# Patient Record
Sex: Male | Born: 1975 | Race: Black or African American | Hispanic: No | Marital: Single | Smoking: Current every day smoker
Health system: Southern US, Community
[De-identification: ages and names within clinical notes are randomized; demographics above are authoritative.]

## PROBLEM LIST (undated history)

## (undated) DIAGNOSIS — F209 Schizophrenia, unspecified: Secondary | ICD-10-CM

## (undated) DIAGNOSIS — F32A Depression, unspecified: Secondary | ICD-10-CM

## (undated) DIAGNOSIS — F319 Bipolar disorder, unspecified: Secondary | ICD-10-CM

## (undated) DIAGNOSIS — F329 Major depressive disorder, single episode, unspecified: Secondary | ICD-10-CM

## (undated) DIAGNOSIS — IMO0002 Reserved for concepts with insufficient information to code with codable children: Secondary | ICD-10-CM

## (undated) HISTORY — PX: ABDOMINAL SURGERY: SHX537

---

## 2014-01-25 ENCOUNTER — Emergency Department (HOSPITAL_COMMUNITY)
Admission: EM | Admit: 2014-01-25 | Discharge: 2014-01-26 | Disposition: A | Discharge status: Other Acute Inpatient Hospital | Payer: Medicaid Other | Attending: Emergency Medicine | Admitting: Emergency Medicine

## 2014-01-25 ENCOUNTER — Encounter (HOSPITAL_COMMUNITY): Payer: Self-pay | Admitting: Emergency Medicine

## 2014-01-25 DIAGNOSIS — F339 Major depressive disorder, recurrent, unspecified: Secondary | ICD-10-CM | POA: Insufficient documentation

## 2014-01-25 DIAGNOSIS — Z8659 Personal history of other mental and behavioral disorders: Secondary | ICD-10-CM | POA: Insufficient documentation

## 2014-01-25 DIAGNOSIS — F17200 Nicotine dependence, unspecified, uncomplicated: Secondary | ICD-10-CM | POA: Insufficient documentation

## 2014-01-25 DIAGNOSIS — F1210 Cannabis abuse, uncomplicated: Secondary | ICD-10-CM | POA: Insufficient documentation

## 2014-01-25 DIAGNOSIS — Z872 Personal history of diseases of the skin and subcutaneous tissue: Secondary | ICD-10-CM | POA: Insufficient documentation

## 2014-01-25 DIAGNOSIS — R45851 Suicidal ideations: Secondary | ICD-10-CM | POA: Insufficient documentation

## 2014-01-25 HISTORY — DX: Bipolar disorder, unspecified: F31.9

## 2014-01-25 HISTORY — DX: Reserved for concepts with insufficient information to code with codable children: IMO0002

## 2014-01-25 HISTORY — DX: Schizophrenia, unspecified: F20.9

## 2014-01-25 LAB — CBC
HEMATOCRIT: 41.8 % (ref 39.0–52.0)
Hemoglobin: 14 g/dL (ref 13.0–17.0)
MCH: 28.2 pg (ref 26.0–34.0)
MCHC: 33.5 g/dL (ref 30.0–36.0)
MCV: 84.3 fL (ref 78.0–100.0)
Platelets: 256 10*3/uL (ref 150–400)
RBC: 4.96 MIL/uL (ref 4.22–5.81)
RDW: 13 % (ref 11.5–15.5)
WBC: 3.7 10*3/uL — AB (ref 4.0–10.5)

## 2014-01-25 LAB — COMPREHENSIVE METABOLIC PANEL
ALT: 15 U/L (ref 0–53)
AST: 19 U/L (ref 0–37)
Albumin: 3.9 g/dL (ref 3.5–5.2)
Alkaline Phosphatase: 80 U/L (ref 39–117)
BILIRUBIN TOTAL: 0.3 mg/dL (ref 0.3–1.2)
BUN: 16 mg/dL (ref 6–23)
CHLORIDE: 104 meq/L (ref 96–112)
CO2: 25 mEq/L (ref 19–32)
Calcium: 9.6 mg/dL (ref 8.4–10.5)
Creatinine, Ser: 1.02 mg/dL (ref 0.50–1.35)
GFR calc non Af Amer: >90 mL/min (ref >90)
GLUCOSE: 110 mg/dL — AB (ref 70–99)
Potassium: 3.8 mEq/L (ref 3.7–5.3)
SODIUM: 142 meq/L (ref 137–147)
Total Protein: 7.2 g/dL (ref 6.0–8.3)

## 2014-01-25 LAB — RAPID URINE DRUG SCREEN, HOSP PERFORMED
AMPHETAMINES: NOT DETECTED
Barbiturates: NOT DETECTED
Benzodiazepines: NOT DETECTED
Cocaine: NOT DETECTED
OPIATES: NOT DETECTED
TETRAHYDROCANNABINOL: POSITIVE — AB

## 2014-01-25 LAB — ETHANOL: Alcohol, Ethyl (B): <11 mg/dL (ref 0–11)

## 2014-01-25 LAB — SALICYLATE LEVEL

## 2014-01-25 LAB — ACETAMINOPHEN LEVEL

## 2014-01-25 MED ORDER — FLUOXETINE HCL 20 MG PO CAPS
20.0000 mg | ORAL_CAPSULE | Freq: Every day | ORAL | Status: DC
  Administered 2014-01-26: 20 mg via ORAL
  Filled 2014-01-25: qty 1

## 2014-01-25 MED ORDER — BUSPIRONE HCL 10 MG PO TABS
5.0000 mg | ORAL_TABLET | Freq: Two times a day (BID) | ORAL | Status: DC
  Administered 2014-01-25 – 2014-01-26 (×2): 5 mg via ORAL
  Filled 2014-01-25 (×2): qty 1

## 2014-01-25 MED ORDER — HYDROXYZINE HCL 25 MG PO TABS: 25.0000 mg | ORAL_TABLET | Freq: Every evening | ORAL | Status: DC | PRN

## 2014-01-25 MED ORDER — LORAZEPAM 1 MG PO TABS: 1.0000 mg | ORAL_TABLET | Freq: Three times a day (TID) | ORAL | Status: DC | PRN

## 2014-01-25 MED ORDER — ACETAMINOPHEN 325 MG PO TABS: 650.0000 mg | ORAL_TABLET | ORAL | Status: DC | PRN

## 2014-01-25 MED ORDER — SODIUM CHLORIDE 0.9 % IV SOLN
INTRAVENOUS | Status: DC
  Administered 2014-01-25: 20 mL/h via INTRAVENOUS

## 2014-01-25 NOTE — ED Notes (Signed)
Pt talking on phone at present.  No distress noted.

## 2014-01-25 NOTE — ED Provider Notes (Signed)
CSN: [id number]     Arrival date & time 01/25/14  1711 History   First MD Initiated Contact with Patient 01/25/14 1716     Chief Complaint  Patient presents with  . Medical Clearance     (Consider location/radiation/quality/duration/timing/severity/associated sxs/prior Treatment) The history is provided by the patient.  pts presents via ems, stating has been feeling depressed w thoughts of suicide. Initially told different stories to ems and providers as to whether he took any extra medication and/or bleach.  On arrival to room pt states he did not drink bleach, and ems notes bottle at home did not appear as though it had been opened in a long time.  Pt did state he took what was left of his medication, which he thinks was 2-3 pills. He denies other medication ingestion. Occasional etoh use/abuse, but denies daily use. Denies other drug use. States physical health at baseline. States depression relates to not being able to find a job, and money issues.     Past Medical History  Diagnosis Date  . Ulcer   . Bipolar disorder   . Schizophrenia    Past Surgical History  Procedure Laterality Date  . Abdominal surgery     No family history on file. History  Substance Use Topics  . Smoking status: Current Every Day Smoker -- 0.25 packs/day for 15 years    Types: Cigarettes  . Smokeless tobacco: Never Used  . Alcohol Use: Yes    Review of Systems  Constitutional: Negative for fever and chills.  HENT: Negative for sore throat.   Eyes: Negative for redness.  Respiratory: Negative for cough and shortness of breath.   Cardiovascular: Negative for chest pain.  Gastrointestinal: Negative for nausea, vomiting and abdominal pain.  Genitourinary: Negative for flank pain.  Musculoskeletal: Negative for back pain and neck pain.  Skin: Negative for rash.  Neurological: Negative for headaches.  Hematological: Does not bruise/bleed easily.  Psychiatric/Behavioral: Positive for dysphoric mood.       Allergies  Review of patient's allergies indicates not on file.  Home Medications   Prior to Admission medications   Not on File   There were no vitals taken for this visit. Physical Exam  Nursing note and vitals reviewed. Constitutional: He is oriented to person, place, and time. He appears well-developed and well-nourished. No distress.  HENT:  Head: Atraumatic.  Mouth/Throat: Oropharynx is clear and moist.  Eyes: Conjunctivae are normal. Pupils are equal, round, and reactive to light. No scleral icterus.  Neck: Neck supple. No tracheal deviation present.  Cardiovascular: Normal rate, regular rhythm, normal heart sounds and intact distal pulses.   Pulmonary/Chest: Effort normal and breath sounds normal. No accessory muscle usage. No respiratory distress.  Abdominal: Soft. He exhibits no distension and no mass. There is no tenderness. There is no rebound and no guarding.  Musculoskeletal: Normal range of motion. He exhibits no edema and no tenderness.  Neurological: He is alert and oriented to person, place, and time.  Skin: Skin is warm and dry. He is not diaphoretic.  Psychiatric: He has a normal mood and affect.  Endorses feelings of depression, but appears to have normal mood/affect. No hallucinations or delusion thoughts. +SI.     ED Course  Procedures (including critical care time)   Results for orders placed during the hospital encounter of 01/25/14  CBC      Result Value Ref Range   WBC 3.7 (*) 4.0 - 10.5 K/uL   RBC 4.96  4.22 - 5.81  MIL/uL   Hemoglobin 14.0  13.0 - 17.0 g/dL   HCT 16.141.8  09.639.0 - 04.552.0 %   MCV 84.3  78.0 - 100.0 fL   MCH 28.2  26.0 - 34.0 pg   MCHC 33.5  30.0 - 36.0 g/dL   RDW 40.913.0  81.111.5 - 91.415.5 %   Platelets 256  150 - 400 K/uL  COMPREHENSIVE METABOLIC PANEL      Result Value Ref Range   Sodium 142  137 - 147 mEq/L   Potassium 3.8  3.7 - 5.3 mEq/L   Chloride 104  96 - 112 mEq/L   CO2 25  19 - 32 mEq/L   Glucose, Bld 110 (*) 70 - 99  mg/dL   BUN 16  6 - 23 mg/dL   Creatinine, Ser 7.821.02  0.50 - 1.35 mg/dL   Calcium 9.6  8.4 - 95.610.5 mg/dL   Total Protein 7.2  6.0 - 8.3 g/dL   Albumin 3.9  3.5 - 5.2 g/dL   AST 19  0 - 37 U/L   ALT 15  0 - 53 U/L   Alkaline Phosphatase 80  39 - 117 U/L   Total Bilirubin 0.3  0.3 - 1.2 mg/dL   GFR calc non Af Amer >90  >90 mL/min   GFR calc Af Amer >90  >90 mL/min  URINE RAPID DRUG SCREEN (HOSP PERFORMED)      Result Value Ref Range   Opiates NONE DETECTED  NONE DETECTED   Cocaine NONE DETECTED  NONE DETECTED   Benzodiazepines NONE DETECTED  NONE DETECTED   Amphetamines NONE DETECTED  NONE DETECTED   Tetrahydrocannabinol POSITIVE (*) NONE DETECTED   Barbiturates NONE DETECTED  NONE DETECTED  ETHANOL      Result Value Ref Range   Alcohol, Ethyl (B) <11  0 - 11 mg/dL  ACETAMINOPHEN LEVEL      Result Value Ref Range   Acetaminophen (Tylenol), Serum <15.0  10 - 30 ug/mL  SALICYLATE LEVEL      Result Value Ref Range   Salicylate Lvl <2.0 (*) 2.8 - 20.0 mg/dL      EKG Interpretation   Date/Time:  Thursday January 25 2014 17:13:56 EDT Ventricular Rate:  65 PR Interval:  117 QRS Duration: 88 QT Interval:  358 QTC Calculation: 372 R Axis:   80 Text Interpretation:  Sinus rhythm Borderline short PR interval Baseline  wander in lead(s) V1 No previous tracing Confirmed by Denton LankSTEINL  MD, Caryn BeeKEVIN  ([id number]) on 01/25/2014 6:25:27 PM      MDM  Iv ns. Labs.  Psych team consulted.  Reviewed nursing notes and prior charts for additional history.   Recheck no gi upset, no nv, no abd pain.  Pt eating/drinking.  Pt remains fully awake and alert.  Psych team eval pending - dispo per psych team.       Suzi RootsKevin E Chablis Losh, MD 01/25/14 [phone]

## 2014-01-25 NOTE — ED Notes (Signed)
Pt transferred from Main ED, report from RN Lauren.  Pt presents with SI, post ingestion of unknown amt of Prozac and Buspirone and bleach.  Pt attempted today, time unknown.  Denies HI, admits to AV hallucinations, seeing Ghosts and hearing things saying to take pills.  Pt reports he was SI earlier, denies at present.  Denies feeling hopeless.  Admits to Schizophrenia and Bipolar DO.  Pt calm & cooperative at present.

## 2014-01-25 NOTE — ED Notes (Signed)
Called to give report, currently at shift change. Will call back.

## 2014-01-25 NOTE — ED Notes (Signed)
Per [employer], when first officer arrived, he was told by the Pt that he had run out of medication and couldn't afford to get new prescriptions filled.  Then, Pt reported to the second officer that he had just taken "a bunch of pills and drank bleach, because no one loves him."    Both medication bottles show that the prescriptions were filled over a month ago (12/14/13) and if the Pt took them as prescribed then they should have been completed.  [employer] reports that the only bleach they found "had not been opened in a long while."   Pt asked [employer] officers to handcuff him "to guarantee that he would be seen."

## 2014-01-25 NOTE — ED Notes (Addendum)
Pt brought in by police. Police states that when called he was going to harm himself and had a knife to his throat. Upon arrival by police, pt removed the knife and the patient states he ingested bleach as well as buspirone and prozac. When I asked if the patient was suicidal, he states he needs he needs help because he was" ready to go on out." Pt is A/O at present. Pt denies pain or using recreational drugs or having audiovisual hallucinations.

## 2014-01-26 ENCOUNTER — Encounter (HOSPITAL_COMMUNITY): Payer: Self-pay | Admitting: *Deleted

## 2014-01-26 ENCOUNTER — Inpatient Hospital Stay (HOSPITAL_COMMUNITY)
Admission: AD | Admit: 2014-01-26 | Discharge: 2014-01-30 | DRG: 885 | Disposition: A | Discharge status: Home / Self Care | Payer: Medicaid Other | Source: Intra-hospital | Attending: Psychiatry | Admitting: Psychiatry

## 2014-01-26 DIAGNOSIS — R45851 Suicidal ideations: Secondary | ICD-10-CM

## 2014-01-26 DIAGNOSIS — F209 Schizophrenia, unspecified: Secondary | ICD-10-CM | POA: Diagnosis present

## 2014-01-26 DIAGNOSIS — F319 Bipolar disorder, unspecified: Secondary | ICD-10-CM | POA: Diagnosis present

## 2014-01-26 DIAGNOSIS — F17200 Nicotine dependence, unspecified, uncomplicated: Secondary | ICD-10-CM | POA: Diagnosis present

## 2014-01-26 DIAGNOSIS — F1210 Cannabis abuse, uncomplicated: Secondary | ICD-10-CM | POA: Diagnosis present

## 2014-01-26 DIAGNOSIS — F411 Generalized anxiety disorder: Secondary | ICD-10-CM | POA: Diagnosis present

## 2014-01-26 DIAGNOSIS — F332 Major depressive disorder, recurrent severe without psychotic features: Secondary | ICD-10-CM

## 2014-01-26 DIAGNOSIS — F333 Major depressive disorder, recurrent, severe with psychotic symptoms: Principal | ICD-10-CM | POA: Diagnosis present

## 2014-01-26 DIAGNOSIS — F329 Major depressive disorder, single episode, unspecified: Secondary | ICD-10-CM | POA: Diagnosis present

## 2014-01-26 MED ORDER — MAGNESIUM HYDROXIDE 400 MG/5ML PO SUSP: 30.0000 mL | Freq: Every day | ORAL | Status: DC | PRN

## 2014-01-26 MED ORDER — FLUOXETINE HCL 20 MG PO CAPS
20.0000 mg | ORAL_CAPSULE | Freq: Every day | ORAL | Status: DC
  Administered 2014-01-27 – 2014-01-30 (×4): 20 mg via ORAL
  Filled 2014-01-26: qty 1
  Filled 2014-01-26: qty 3
  Filled 2014-01-26 (×4): qty 1

## 2014-01-26 MED ORDER — BUSPIRONE HCL 5 MG PO TABS
5.0000 mg | ORAL_TABLET | Freq: Two times a day (BID) | ORAL | Status: DC
  Administered 2014-01-26 – 2014-01-30 (×8): 5 mg via ORAL
  Filled 2014-01-26 (×4): qty 1
  Filled 2014-01-26: qty 6
  Filled 2014-01-26 (×5): qty 1
  Filled 2014-01-26: qty 6
  Filled 2014-01-26 (×2): qty 1

## 2014-01-26 MED ORDER — ALUM & MAG HYDROXIDE-SIMETH 200-200-20 MG/5ML PO SUSP
30.0000 mL | ORAL | Status: DC | PRN
Start: 2014-01-26 — End: 2014-01-30

## 2014-01-26 MED ORDER — HYDROXYZINE HCL 25 MG PO TABS
25.0000 mg | ORAL_TABLET | Freq: Every evening | ORAL | Status: DC | PRN
  Administered 2014-01-26 – 2014-01-29 (×4): 50 mg via ORAL
  Filled 2014-01-26 (×4): qty 2

## 2014-01-26 NOTE — BH Assessment (Signed)
Tele Assessment Note   Bryan Conway is a 38 y.o. male who voluntarily presents to [facility] with SI/Depression.  Pt denies HI. Pt reports the following: he has been depressed and SI x1 month and attributes current mental state to being unemployed, financial problems and not feeling loved.  Pt admits he ingested approx 10 pills and drank bleach, stating that he's attempted to harm himself, 2x's in the past by overdose.  Pt says he was hearing voices with command to harm himself, not currently endorsing aud/visual halluc.  Pt says he stopped alcohol and marijuana, last use was 1 month ago.  He says stopped using so he could find a job and has been successful in finding work.  Pt says he was consuming 4-40's, daily and smoking 6-7 blunts, daily.  This Clinical research associate discussed interview with Alberteen Sam, NP who recommends inpt admission, however no beds avail for this pt.  TTS will continue to seek placement for this pt.           Axis I: Major depressive disorder, Recurrent episode, With psychotic features; Alcohol use disorder;Cannabis use disorder  Axis II: Deferred Axis III:  Past Medical History  Diagnosis Date  . Ulcer   . Bipolar disorder   . Schizophrenia    Axis IV: other psychosocial or environmental problems, problems related to social environment and problems with primary support group Axis V: 31-40 impairment in reality testing  Past Medical History:  Past Medical History  Diagnosis Date  . Ulcer   . Bipolar disorder   . Schizophrenia     Past Surgical History  Procedure Laterality Date  . Abdominal surgery      Family History: No family history on file.  Social History:  reports that he has been smoking Cigarettes.  He has a 3.75 pack-year smoking history. He has never used smokeless tobacco. He reports that he drinks alcohol. He reports that he uses illicit drugs (Marijuana).  Additional Social History:  Alcohol / Drug Use Pain Medications: None  Prescriptions: See MAR  Over the  Counter: See MAR  History of alcohol / drug use?: Yes Longest period of sobriety (when/how long): None  Negative Consequences of Use: Work / School;Personal relationships;Financial Withdrawal Symptoms: Other (Comment) (No w/d sxs ) Substance #1 Name of Substance 1: Alcohol  1 - Age of First Use: Teens  1 - Amount (size/oz): 4-40's  1 - Frequency: Daily  1 - Duration: On-going  1 - Last Use / Amount: 1 month ago  Substance #2 Name of Substance 2: THC  2 - Age of First Use: Teens  2 - Amount (size/oz): 6-7 Blunts 2 - Frequency: Daily  2 - Duration: On-going  2 - Last Use / Amount: 1 Month Ago   CIWA: CIWA-Ar BP: 126/94 mmHg Pulse Rate: 68 COWS:    Allergies: Allergies not on file  Home Medications:  (Not in a hospital admission)  OB/GYN Status:  No LMP for male patient.  General Assessment Data Location of Assessment: [facility] ED Is this a Tele or Face-to-Face Assessment?: Tele Assessment Is this an Initial Assessment or a Re-assessment for this encounter?: Initial Assessment Living Arrangements: Alone Can pt return to current living arrangement?: Yes Admission Status: Voluntary Is patient capable of signing voluntary admission?: Yes Transfer from: Acute Hospital Referral Source: MD  Medical Screening Exam [facility] Walk-in ONLY) Medical Exam completed: No Reason for MSE not completed: Other:  [facility] Crisis Care Plan Living Arrangements: Alone Name of Psychiatrist: Dollene Cleveland to remember  Name  of Therapist: Unable to remember   Education Status Is patient currently in school?: No Current Grade: None  Highest grade of school patient has completed: None  Name of school: None  Contact person: None   Risk to self Suicidal Ideation: Yes-Currently Present Suicidal Intent: Yes-Currently Present Is patient at risk for suicide?: Yes Suicidal Plan?: Yes-Currently Present Specify Current Suicidal Plan: Overdose on pills, ingest bleach  Access to Means: Yes Specify Access to  Suicidal Means: Pills, toxic liquids  What has been your use of drugs/alcohol within the last 12 months?: Hx of alcohol and thx use  Previous Attempts/Gestures: Yes How many times?: 2 Other Self Harm Risks: None  Triggers for Past Attempts: Unpredictable Intentional Self Injurious Behavior: None Family Suicide History: No Recent stressful life event(s): Financial Problems (Unemployment, relational ) Persecutory voices/beliefs?: No Depression: Yes Depression Symptoms: Loss of interest in usual pleasures;Feeling worthless/self pity Substance abuse history and/or treatment for substance abuse?: Yes Suicide prevention information given to non-admitted patients: Not applicable  Risk to Others Homicidal Ideation: No Thoughts of Harm to Others: No Current Homicidal Intent: No Current Homicidal Plan: No Access to Homicidal Means: No Identified Victim: None  History of harm to others?: No Assessment of Violence: None Noted Violent Behavior Description: None  Does patient have access to weapons?: No Criminal Charges Pending?: No Does patient have a court date: No  Psychosis Hallucinations: None noted Delusions: None noted  Mental Status Report Appear/Hygiene: Disheveled Eye Contact: Good Motor Activity: Unremarkable Speech: Logical/coherent;Slow Level of Consciousness: Alert Mood: Depressed Affect: Depressed Anxiety Level: None Thought Processes: Relevant;Coherent Judgement: Impaired Orientation: Person;Place;Time;Situation Obsessive Compulsive Thoughts/Behaviors: None  Cognitive Functioning Concentration: Decreased Memory: Recent Intact;Remote Intact IQ: Average Insight: Poor Impulse Control: Poor Appetite: Fair Weight Loss: 0 Weight Gain: 0 Sleep: No Change Total Hours of Sleep: 5 Vegetative Symptoms: None  ADLScreening [facility] Assessment [facility]) Patient's cognitive ability adequate to safely complete daily activities?: Yes Patient able to express need for  assistance with ADLs?: No Independently performs ADLs?: Yes (appropriate for developmental age)  Prior Inpatient Therapy Prior Inpatient Therapy: Yes Prior Therapy Dates: Unk  Prior Therapy Facilty/Provider(s): [facility]  Reason for Treatment: SI/Depression   Prior Outpatient Therapy Prior Outpatient Therapy: Yes Prior Therapy Dates: Current  Prior Therapy Facilty/Provider(s): Unable to remember  Reason for Treatment: Med Mgt/Therapy   ADL Screening (condition at time of admission) Patient's cognitive ability adequate to safely complete daily activities?: Yes Is the patient deaf or have difficulty hearing?: No Does the patient have difficulty seeing, even when wearing glasses/contacts?: No Does the patient have difficulty concentrating, remembering, or making decisions?: Yes Patient able to express need for assistance with ADLs?: No Does the patient have difficulty dressing or bathing?: No Independently performs ADLs?: Yes (appropriate for developmental age) Does the patient have difficulty walking or climbing stairs?: No Weakness of Legs: None Weakness of Arms/Hands: None  Home Assistive Devices/Equipment Home Assistive Devices/Equipment: None  Therapy Consults (therapy consults require a physician order) PT Evaluation Needed: No OT Evalulation Needed: No SLP Evaluation Needed: No Abuse/Neglect Assessment (Assessment to be complete while patient is alone) Physical Abuse: Denies Verbal Abuse: Denies Sexual Abuse: Denies Exploitation of patient/patient's resources: Denies Self-Neglect: Denies Values / Beliefs Cultural Requests During Hospitalization: None Spiritual Requests During Hospitalization: None Consults Spiritual Care Consult Needed: No Social Work Consult Needed: No Merchant navy officerAdvance Directives (For Healthcare) Advance Directive: Patient does not have advance directive;Patient would not like information Pre-existing out of facility DNR order (yellow  form or pink  MOST form): No Nutrition Screen- [facility] Adult/[facility] Patient's home diet: Regular  Additional Information 1:1 In Past 12 Months?: No CIRT Risk: No Elopement Risk: No Does patient have medical clearance?: Yes     Disposition:  Disposition Initial Assessment Completed for this Encounter: Yes Disposition of Patient: Inpatient treatment program;Referred to ([facility]--no beds avail) Type of inpatient treatment program: Adult Patient referred to: Other (Comment) ([facility]--no beds avail )  Murrell Reddeneresa C Orris Perin 01/26/2014 1:43 AM

## 2014-01-26 NOTE — Progress Notes (Signed)
Adult Psychoeducational Group Note  Date:  01/26/2014 Time:  9:35 PM  Group Topic/Focus:  Wrap-Up Group:   The focus of this group is to help patients review their daily goal of treatment and discuss progress on daily workbooks.  Participation Level:  Active  Participation Quality:  Appropriate, Attentive, Sharing and Supportive  Affect:  Appropriate  Cognitive:  Alert, Appropriate and Oriented  Insight: Appropriate  Engagement in Group:  Supportive  Modes of Intervention:  Education, Socialization and Support  Additional Comments:  Pt attended and participated in group.  Bryan Conway 01/26/2014, 9:35 PM

## 2014-01-26 NOTE — Progress Notes (Signed)
Patient ID: Bryan RoDavid Ferner, male   DOB: 05-25-1976, 38 y.o.   MRN: [id number] D: "came cause I need help" "schizophrenia, I been that, I came here from [place], my sister know" "a little suicidal" "my medicine help me apprehend my thoughts when my mind play tricks on me saying things" "I ran out of medicine, can't afford them" Pt. Animated, use lot of gestures, body language when talking. A: Writer introduced self with client, provided emotional support encourage him to become familiar with medications, FYI, encouraged group Staff will monitor q6215min for safety. R: Writer will review medications during administration, client attended group. Pt. Is safe on the unit.

## 2014-01-26 NOTE — Progress Notes (Signed)
Patient ID: Leafy RoDavid Offner, male   DOB: June 25, 1976, 38 y.o.   MRN: [id number] Nursing admission note:  Mr. Royal HawthornBetts is a 38 yo male [facility] to SI/Depression.  Patient reports decreased sleep, concentration, decreased appetite and anxiety.  Patient stated in ED that he injected approx. 10 pills and drank some bleach. He also reports two previous suicide attempts. Patient states that he was hearing command auditory hallucinations.  He also reports that he stopped drinking and smoking marijuana last month.  He has a hx of substance abuse and has been in a treatment facility in the past.  Patient is a poor historian.  He reports he moved here from [place] 2 years ago to "turn my life around.  I'm depressed because I can't find a job and I ran out of medicine."  Patient reports consuming 4 40's a day and smoking 6-7 blunts daily.  Patient states that [facility].  He has a past hx of bleeding ulcer which he had abdominal surgery (not sure of the date).  No other pertinent medical hx.  Patient was oriented to room and unit.  At this time, he denies SI/HI/AVH.  He reports his depression as a 5.

## 2014-01-26 NOTE — Consult Note (Signed)
  [name] presented with feeling depressed and  thoughts of suicide. Initially told different stories to ems and providers as to whether he took any extra medication and/or bleach. On arrival to room pt states he did not drink bleach, and ems notes bottle at home did not appear as though it had been opened in a long time. Pt did state he took what was left of his medication, which he thinks was 2-3 pills. He denies other medication ingestion. Occasional etoh use/abuse, but denies daily use. Denies other drug use. Urine drug positive for marijuana. States stressors are Dad died 4 years ago and Mom died 4 months ago.  Poorly motivated and non compliant with medications. A: Major depressive disorder, recurrent severe Marijuana use disorder  Plan: Admit to Inpatient unit. [address] bed available.

## 2014-01-26 NOTE — Tx Team (Signed)
Initial Interdisciplinary Treatment Plan  PATIENT STRENGTHS: (choose at least two) Average or above average intelligence Capable of independent living Communication skills Supportive family/friends  PATIENT STRESSORS: Financial difficulties Medication change or noncompliance Occupational concerns Substance abuse   PROBLEM LIST: Problem List/Patient Goals Date to be addressed Date deferred Reason deferred Estimated date of resolution  "I was feeling suicidal" 01/26/14     Prior substance abuse 01/26/14     "I need to find a job" 01/26/14     Depression 01/26/14                                    DISCHARGE CRITERIA:  Ability to meet basic life and health needs Improved stabilization in mood, thinking, and/or behavior Motivation to continue treatment in a less acute level of care Need for constant or close observation no longer present  PRELIMINARY DISCHARGE PLAN: Outpatient therapy Return to previous living arrangement  PATIENT/FAMIILY INVOLVEMENT: This treatment plan has been presented to and reviewed with the patient, Bryan Conway.  The patient and family have been given the opportunity to ask questions and make suggestions.  Cranford MonCaroline Evans Ouida Abeyta 01/26/2014, 6:33 PM

## 2014-01-27 DIAGNOSIS — R45851 Suicidal ideations: Secondary | ICD-10-CM

## 2014-01-27 DIAGNOSIS — F1210 Cannabis abuse, uncomplicated: Secondary | ICD-10-CM

## 2014-01-27 DIAGNOSIS — F333 Major depressive disorder, recurrent, severe with psychotic symptoms: Principal | ICD-10-CM

## 2014-01-27 MED ORDER — FLUOXETINE HCL 10 MG PO CAPS
10.0000 mg | ORAL_CAPSULE | Freq: Every day | ORAL | Status: DC
  Administered 2014-01-27: 10 mg via ORAL
  Filled 2014-01-27 (×4): qty 1

## 2014-01-27 MED ORDER — NICOTINE 21 MG/24HR TD PT24
21.0000 mg | MEDICATED_PATCH | Freq: Every day | TRANSDERMAL | Status: DC
  Administered 2014-01-27 – 2014-01-30 (×4): 21 mg via TRANSDERMAL
  Filled 2014-01-27 (×6): qty 1

## 2014-01-27 MED ORDER — BENZOCAINE 10 % MT GEL
Freq: Four times a day (QID) | OROMUCOSAL | Status: DC | PRN
  Administered 2014-01-27: 15:00:00 via OROMUCOSAL
  Filled 2014-01-27: qty 9.4

## 2014-01-27 MED ORDER — ARIPIPRAZOLE 5 MG PO TABS
5.0000 mg | ORAL_TABLET | Freq: Every day | ORAL | Status: DC
  Administered 2014-01-27 – 2014-01-30 (×4): 5 mg via ORAL
  Filled 2014-01-27 (×4): qty 1
  Filled 2014-01-27: qty 3
  Filled 2014-01-27: qty 1

## 2014-01-27 NOTE — [facility] Suicide Risk Assessment (Signed)
Suicide Risk Assessment  Admission Assessment     Nursing information obtained from:    Demographic factors:    Current Mental Status:    Loss Factors:    Historical Factors:    Risk Reduction Factors:    Total Time spent with patient: 45 minutes  CLINICAL FACTORS:   Dysthymia Alcohol/Substance Abuse/Dependencies Unstable or Poor Therapeutic Relationship Previous Psychiatric Diagnoses and Treatments  Psychiatric Specialty Exam:     Blood pressure 141/94, pulse 63, temperature 97.4 F (36.3 C), temperature source Oral, resp. rate 18, height 5\' 10"  (1.778 m), weight 58.514 kg (129 lb).Body mass index is 18.51 kg/(m^2).  General Appearance: Casual  Eye Contact::  Fair  Speech:  Slow  Volume:  Decreased  Mood:  Dysphoric  Affect:  Depressed  Thought Process:  Linear  Orientation:  Full (Time, Place, and Person)  Thought Content:  Rumination  Suicidal Thoughts:  Yes.  without intent/plan  Homicidal Thoughts:  No  Memory:  Recent;   Fair  Judgement:  Poor  Insight:  Lacking  Psychomotor Activity:  Decreased  Concentration:  Fair  Recall:  Fis[employer]Fair  Fund of Knowledge:Fair  Language: Fair  Akathisia:  Negative  Handed:  Right  AIMS (if indicated):     Assets:  Desire for Improvement  Sleep:  Number of Hours: 5   Musculoskeletal: Strength & Muscle Tone: within normal limits Gait & Station: normal Patient leans: N/A  COGNITIVE FEATURES THAT CONTRIBUTE TO RISK:  Closed-mindedness Polarized thinking    SUICIDE RISK:   Moderate:  Frequent suicidal ideation with limited intensity, and duration, some specificity in terms of plans, no associated intent, good self-control, limited dysphoria/symptomatology, some risk factors present, and identifiable protective factors, including available and accessible social support.  PLAN OF CARE:  I certify that inpatient services furnished can reasonably be expected to improve the patient's condition.  Bryan RossNadeem Bessie Boyte MD 01/27/2014, 9:55  AM

## 2014-01-27 NOTE — Progress Notes (Signed)
Patient ID: Bryan Conway, male   DOB: 1976/05/20, 38 y.o.   MRN: [id number]  Patient in assigned bed sleeping. Respirations are even and unlabored no S/S of distress. Q15 minute safety checks are maintained for safety.
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
## 2014-01-27 NOTE — [facility] Group Notes (Signed)
[facility] LCSW Group Therapy Note  01/27/2014 / 1:15 PM  Type of Therapy and Topic:  Group Therapy: Avoiding Self-Sabotaging and Enabling Behaviors  Participation Level:  Active   Mood: irritable  Description of Group:     Learn how to identify obstacles, self-sabotaging and enabling behaviors, what are they, why do we do them and what needs do these behaviors meet? Discuss unhealthy relationships and how to have positive healthy boundaries with those that sabotage and enable. Explore aspects of self-sabotage and enabling in yourself and how to limit these self-destructive behaviors in everyday life.  Therapeutic Goals: 1. Patient will identify one obstacle that relates to self-sabotage and enabling behaviors 2. Patient will identify one personal self-sabotaging or enabling behavior they did prior to admission 3. Patient able to establish a plan to change the above identified behavior they did prior to admission:  4. Patient will demonstrate ability to communicate their needs through discussion and/or role plays.   Summary of Patient Progress: The main focus of today's process group was to explain to the adolescent what "self-sabotage" means and use Motivational Interviewing to discuss what benefits, negative or positive, were involved in a self-identified self-sabotaging behavior. We then talked about stages of change flow chart and each patient identified where they were along with their self sabotaging behavior if any. Patient shared that he is looking forward to a better life during warm up and was quiet for majority of group until closure when he made multiple attempts to side track closure and become argumentative. Patient responded to redirection with resistance and sarcasm until he was simply ignored.    Therapeutic Modalities:   Cognitive Behavioral Therapy Person-Centered Therapy Motivational Interviewing   Carney Bernatherine C Nili Honda, LCSW

## 2014-01-27 NOTE — Progress Notes (Signed)
D.  Pt. Denies SI/HI and denies A/V hallucinations.  Reports depression "good" and hopelessness as a 1.  Denies any physical problems at present. A.  Pt. Encouraged to notify writer or staff if he has any concerns or issues. R.  Pt. Receptive and remains safe.

## 2014-01-27 NOTE — Progress Notes (Signed)
[facility] Group Notes:  (Nursing/MHT/Case Management/Adjunct)  Date:  01/27/2014  Time:  11:25 PM  Type of Therapy:  Group Therapy  Participation Level:  Active  Participation Quality:  Intrusive and Redirectable  Affect:  Blunted and Excited  Cognitive:  Lacking  Insight:  Limited  Engagement in Group:  Distracting and Off Topic  Modes of Intervention:  Limit-setting, Socialization and Support  Summary of Progress/Problems: Pt. Was distracting in during group discussion. Pt. Was redirectable.  Sondra ComeRyan J Demonie Kassa 01/27/2014, 11:25 PM

## 2014-01-27 NOTE — Progress Notes (Signed)
Adult Psychoeducational Group Note  Date:  01/27/2014 Time:  0900  Group Topic/Focus:  Coping With Mental Health Crisis:   The purpose of this group is to help patients identify strategies for coping with mental health crisis.  Group discusses possible causes of crisis and ways to manage them effectively.  Participation Level:  None  Participation Quality:  Drowsy  Affect:  Flat  Cognitive:  Appropriate  Insight: Improving  Engagement in Group:  None  Modes of Intervention:  Clarification, Discussion and Exploration  Additional Comments:  Pt sat quietly through group but did not share.  Annabell Howellsric Ivan Avan Gullett 01/27/2014, 1:04 PM

## 2014-01-28 DIAGNOSIS — F332 Major depressive disorder, recurrent severe without psychotic features: Secondary | ICD-10-CM

## 2014-01-28 MED ORDER — ENSURE COMPLETE PO LIQD
237.0000 mL | Freq: Two times a day (BID) | ORAL | Status: DC
  Administered 2014-01-28 – 2014-01-30 (×5): 237 mL via ORAL

## 2014-01-28 NOTE — Progress Notes (Signed)
Adult Psychoeducational Group Note  Date:  01/28/2014 Time:  9:15 PM  Group Topic/Focus:  Wrap-Up Group:   The focus of this group is to help patients review their daily goal of treatment and discuss progress on daily workbooks.  Participation Level:  Active  Participation Quality:  Appropriate  Affect:  Appropriate  Cognitive:  Appropriate  Insight: Appropriate  Engagement in Group:  Engaged  Modes of Intervention:  Support  Additional Comments:  Pt stated that positive thing that happened was that he learned a lot from the group today as to how to stay focused on what he needs to do when he leaves here and that he was able to go to the gym today. Pt was given support to continue pushing in the positive side of things  Bryan Conway 01/28/2014, 9:15 PM

## 2014-01-28 NOTE — Progress Notes (Signed)
D.  Pt. Reports doing "well."  Denies SI/HI and reports Depression  0 and Hopeless as a 0.  Pt. Attending groups.  Pt. Focused on going to a program when he leaves this facility. A.  Encouragement and support given. R.  Pt. Receptive.

## 2014-01-28 NOTE — Progress Notes (Signed)
[facility]  01/28/2014 9:04 PM Bryan Conway  MRN:  [id number] Subjective:  Pt reports that he feels much better today and that he was "in a bad place" when he came in. Pt is actively participating in group therapy and is compliant with medications as well. Pt denies SI, HI, and AVH, contracts for safety. Pt affirms good sleep and good appetite as well. Denies all physical complaints at this time.   Diagnosis:   DSM5:  Depressive Disorders:  Major Depressive Disorder - Severe (296.23) Total Time spent with patient: 30 minutes  Axis I: Major Depression, Recurrent severe Axis II: Deferred Axis III:  Past Medical History  Diagnosis Date  . Ulcer   . Bipolar disorder   . Schizophrenia    Axis IV: other psychosocial or environmental problems and problems related to social environment Axis V: 41-50 serious symptoms  ADL's:  Intact  Sleep: Good  Appetite:  Good  Suicidal Ideation:  Denies Homicidal Ideation:  Denies AEB (as evidenced by):  Psychiatric Specialty Exam: Physical Exam  Review of Systems  Constitutional: Negative.   HENT: Negative.   Eyes: Negative.   Respiratory: Negative.   Cardiovascular: Negative.   Gastrointestinal: Negative.   Genitourinary: Negative.   Musculoskeletal: Negative.   Skin: Negative.   Neurological: Negative.   Endo/Heme/Allergies: Negative.   Psychiatric/Behavioral: Positive for depression and suicidal ideas. The patient is nervous/anxious.     Blood pressure 127/85, pulse 61, temperature 96.9 F (36.1 C), temperature source Oral, resp. rate 16, height 5\' 10"  (1.778 m), weight 58.514 kg (129 lb).Body mass index is 18.51 kg/(m^2).  General Appearance: Casual  Eye Contact::  Good  Speech:  Clear and Coherent  Volume:  Normal  Mood:  Euthymic  Affect:  Appropriate  Thought Process:  Coherent  Orientation:  Full (Time, Place, and Person)  Thought Content:  WDL  Suicidal Thoughts:  No  Homicidal Thoughts:  No  Memory:   Immediate;   Fair Recent;   Fair Remote;   Fair  Judgement:  Good  Insight:  Good  Psychomotor Activity:  Normal  Concentration:  Good  Recall:  Good  Fund of Knowledge:Good  Language: Good  Akathisia:  No  Handed:  Right  AIMS (if indicated):     Assets:  Communication Skills Desire for Improvement Resilience  Sleep:  Number of Hours: 6.5   Musculoskeletal: Strength & Muscle Tone: within normal limits Gait & Station: normal Patient leans: N/A  Current Medications: Current Facility-Administered Medications  Medication Dose Route Frequency Provider Last Rate Last Dose  . alum & mag hydroxide-simeth (MAALOX/MYLANTA) 200-200-20 MG/5ML suspension 30 mL  30 mL Oral Q4H PRN Nanine MeansJamison Lord, NP      . ARIPiprazole (ABILIFY) tablet 5 mg  5 mg Oral Daily Thresa RossNadeem Antario Yasuda, MD   5 mg at 01/28/14 0802  . benzocaine (ORAJEL) 10 % mucosal gel   Mouth/Throat QID PRN Beau FannyJohn C Withrow, FNP      . busPIRone (BUSPAR) tablet 5 mg  5 mg Oral BID Nanine MeansJamison Lord, NP   5 mg at 01/28/14 1726  . feeding supplement (ENSURE COMPLETE) (ENSURE COMPLETE) liquid 237 mL  237 mL Oral BID BM Earna CoderHaley A Hawkins, RD   237 mL at 01/28/14 1403  . FLUoxetine (PROZAC) capsule 10 mg  10 mg Oral Daily Thresa RossNadeem Constancia Geeting, MD   10 mg at 01/27/14 1519  . FLUoxetine (PROZAC) capsule 20 mg  20 mg Oral Daily Nanine MeansJamison Lord, NP   20 mg at 01/28/14 0805  .  hydrOXYzine (ATARAX/VISTARIL) tablet 25-50 mg  25-50 mg Oral QHS PRN Nanine MeansJamison Lord, NP   50 mg at 01/27/14 2130  . magnesium hydroxide (MILK OF MAGNESIA) suspension 30 mL  30 mL Oral Daily PRN Nanine MeansJamison Lord, NP      . nicotine (NICODERM CQ - dosed in mg/24 hours) patch 21 mg  21 mg Transdermal Daily Nehemiah SettleJanardhaha R Jonnalagadda, MD   21 mg at 01/28/14 0805    Lab Results: No results found for this or any previous visit (from the past 48 hour(s)).  Physical Findings: AIMS: Facial and Oral Movements Muscles of Facial Expression: None, normal Lips and Perioral Area: None, normal Jaw: None,  normal Tongue: None, normal,Extremity Movements Upper (arms, wrists, hands, fingers): None, normal Lower (legs, knees, ankles, toes): None, normal, Trunk Movements Neck, shoulders, hips: None, normal, Overall Severity Severity of abnormal movements (highest score from questions above): None, normal Incapacitation due to abnormal movements: None, normal Patient's awareness of abnormal movements (rate only patient's report): No Awareness, Dental Status Current problems with teeth and/or dentures?: Yes (patient has several broken teeth) Does patient usually wear dentures?: No  CIWA:    COWS:     Treatment Plan Summary: Daily contact with patient to assess and evaluate symptoms and progress in treatment Medication management  Plan: Review of chart, vital signs, medications, and notes.  1-Individual and group therapy  2-Medication management for depression and anxiety: Medications reviewed with the patient and she stated no untoward effects, unchanged. 3-Coping skills for depression, anxiety  4-Continue crisis stabilization and management  5-Address health issues--monitoring vital signs, stable  6-Treatment plan in progress to prevent relapse of depression and anxiety  Medical Decision Making Problem Points:  Established problem, stable/improving (1), Review of last therapy session (1) and Review of psycho-social stressors (1) Data Points:  Review or order clinical lab tests (1) Review or order medicine tests (1) Review of medication regiment & side effects (2) Review of new medications or change in dosage (2)  I certify that inpatient services furnished can reasonably be expected to improve the patient's condition.   Beau FannyJohn C Withrow, FNP-BC 01/28/2014, 9:04 PM I agreed with findings and treatment plan of this patient

## 2014-01-28 NOTE — [facility] Group Notes (Signed)
[facility] LCSW Group Therapy Note   01/28/2014 1:15 PM   Type of Therapy and Topic: Group Therapy: Feelings Around Returning Home & Establishing a Supportive Framework and Activity to Identify signs of Improvement or Decompensation    Participation Level:  Minimal  Mood: Irritable  Description of Group:  Patients first processed thoughts and feelings about up coming discharge. These included fears of upcoming changes, lack of change, new living environments, judgements and expectations from others and overall stigma of MH issues. We then discussed what is a supportive framework? What does it look like feel like and how do I discern it from and unhealthy non-supportive network? Learn how to cope when supports are not helpful and don't support you. Discuss what to do when your family/friends are not supportive.   Therapeutic Goals Addressed in Processing Group:  1. Patient will identify one healthy supportive network that they can use at discharge. 2. Patient will identify one factor of a supportive framework and how to tell it from an unhealthy network. 3. Patient able to identify one coping skill to use when they do not have positive supports from others. 4. Patient will demonstrate ability to communicate their needs through discussion and/or role plays.  Summary of Patient Progress:  Summary of Progress/Problems: The main focus of today's process group was to identify the patient's current support system and decide on other supports that can be put in place. An emphasis was placed on using counselor, doctor, therapy groups, 12-step groups, and problem-specific support groups to expand supports. There was also an extensive discussion about what constitutes a healthy support versus an unhealthy support.  The patient expressed understanding of the concepts presented, and agreed that supports are important. Patient shares feeling that staff does not do enough to provide support and when asked what that  would look like patient was unable to describe other than to state "I should not be expected to pay for medication, expensive medication, you tell me I need to take."  Carney Bernatherine C Zayde Stroupe, LCSW

## 2014-01-28 NOTE — Progress Notes (Signed)
Nutrition Note Patient identified on the Malnutrition Screening Tool (MST) Report.  Wt Readings from Last 10 Encounters:  01/26/14 129 lb (58.514 kg)   Body mass index is 18.51 kg/(m^2). Patient meets criteria for normal body weight based on current BMI.   Discussed intake PTA with patient and compared to intake presently.  Discussed changes in intake, if any, and encouraged adequate intake of meals and snacks. Current diet order is regular and pt is also offered choice of unit snacks mid-morning and mid-afternoon.  Pt is eating as desired.   Labs and medications reviewed.   Nutrition Dx:  Unintended wt change r/t suboptimal oral intake AEB pt report  Assessment:  - Pt reports that he has been [facility], but that he had lost about 15 lbs over the past three years. He says that he is trying to increase his weight.   Interventions:   Discussed the importance of nutrition and encouraged intake of food and beverages.     Discussed weight goals with patient.   Supplements: Ensure Complete po BID, each supplement provides 350 kcal and 13 grams of protein   No additional nutrition interventions warranted at this time. If nutrition issues arise, please consult RD.   Ebbie LatusHaley Hawkins RD, LDN

## 2014-01-28 NOTE — Progress Notes (Signed)
Patient has been up and active on the unit, attended group this evening and has voiced no complaints. Patient currently denies having pain, -si/hi/a/v hall. Support and encouragement offered, safety maintained on unit, will continue to monitor.  

## 2014-01-28 NOTE — [facility] Group Notes (Signed)
[facility] Group Notes:  (Nursing/MHT/Case Management/Adjunct)  Date:  01/28/2014  Time:  1:10 PM  Type of Therapy:  Psychoeducational Skills  Participation Level:  Minimal  Participation Quality:  Attentive  Affect:  Anxious  Cognitive:  Alert  Insight:  Improving  Engagement in Group:  Developing/Improving  Modes of Intervention:  Discussion, Education and Support  Summary of Progress/Problems:The focus of this group was to help pt. Identify unhealthy support system and change  to healthy support system.  Krystyl Cannell Dawkins Jarvis Knodel 01/28/2014, 1:10 PM

## 2014-01-29 NOTE — Progress Notes (Signed)
D: Pt presents anxious this morning, silly and attention seeking. Pt is minimizing his symptoms this morning and denies feeling SI or depressed. Pt rates dep and hopeless 0/10. Pt has poor insight for treatment and is requesting discharge today. A: Medications administered as ordered per MD. Verbal support given. Pt encouraged to attend groups. 15 minute checks performed for safety. R: Pt safety maintained at this time.

## 2014-01-29 NOTE — Tx Team (Signed)
Interdisciplinary Treatment Plan Update (Adult)  Date: 01/29/2014  Time Reviewed:  9:45 AM  Progress in Treatment: Attending groups: Yes Participating in groups:  Yes Taking medication as prescribed:  Yes Tolerating medication:  Yes Family/Significant othe contact made: CSW assessing  Patient understands diagnosis:  Yes Discussing patient identified problems/goals with staff:  Yes Medical problems stabilized or resolved:  Yes Denies suicidal/homicidal ideation: Yes Issues/concerns per patient self-inventory:  Yes Other:  New problem(s) identified: N/A  Discharge Plan or Barriers: CSW assessing for appropriate referrals.  Reason for Continuation of Hospitalization: Anxiety Depression Medication Stabilization  Comments: N/A  Estimated length of stay: 1 day, d/c tomorrow  For review of initial/current patient goals, please see plan of care.  Attendees: Patient:     Family:     Physician:     Nursing:   Neill Loftarol Davis, RN 01/29/2014 10:12 AM   Clinical Social Worker:  Reyes Ivanhelsea Horton, LCSW 01/29/2014 10:12 AM   Other: Verne SpurrNeil Mashburn, PA 01/29/2014 10:12 AM   Other:  Sherrye PayorValerie Noch, care coordination 01/29/2014 10:12 AM   Other:  Juline PatchQuylle Hodnett, LCSW 01/29/2014 10:12 AM   Other:  Onnie BoerJennifer Clark, case manager 01/29/2014 10:12 AM   Other: Liborio NixonPatrice White, RN 01/29/2014 10:13 AM   Other: Stephan MinisterMorgan Armstrong, PA student 01/29/2014 10:13 AM   Other:    Other:    Other:    Other:     Scribe for Treatment Team:   Carmina MillerHorton, Emonte Dieujuste Nicole, 01/29/2014 10:12 AM

## 2014-01-29 NOTE — Progress Notes (Signed)
Patient ID: Bryan Conway, male   DOB: [birth year], 38 y.o.   MRN: [id number] [facility] MD Progress Note  01/29/2014 1:30 PM Bryan Conway  MRN:  [id number] Subjective:  Met with patient to discuss his progress and treatment. He reports that he is not suicidal, not homicidal. He rates his depression as a 2/10, and says the medication is helping. He reports that he had been off of his medicine x 1 day prior to admission.  States no HI/AVH.   He says his anxiety is better when he takes his medication. Diagnosis:   DSM5:  Depressive Disorders:  Major Depressive Disorder - Severe (296.23) Total Time spent with patient: 30 minutes  Axis I: Major Depression, Recurrent severe Axis II: Deferred Axis III:  Past Medical History  Diagnosis Date  . Ulcer   . Bipolar disorder   . Schizophrenia    Axis IV: other psychosocial or environmental problems and problems related to social environment Axis V: 41-50 serious symptoms  ADL's:  Intact  Sleep: Good  Appetite:  Good  Suicidal Ideation:  Denies Homicidal Ideation:  Denies AEB (as evidenced by):  Psychiatric Specialty Exam: Physical Exam  Review of Systems  Constitutional: Negative.   HENT: Negative.   Eyes: Negative.   Respiratory: Negative.   Cardiovascular: Negative.   Gastrointestinal: Negative.   Genitourinary: Negative.   Musculoskeletal: Negative.   Skin: Negative.   Neurological: Negative.   Endo/Heme/Allergies: Negative.   Psychiatric/Behavioral: Positive for depression and suicidal ideas. The patient is nervous/anxious.     Blood pressure 123/86, pulse 86, temperature 97.4 F (36.3 C), temperature source Oral, resp. rate 20, height _0  (1.778 m), weight 58.514 kg (129 lb).Body mass index is 18.51 kg/(m^2).  General Appearance: Casual  Eye Contact::  Good  Speech:  Clear and Coherent  Volume:  Normal  Mood:  Euthymic  Affect:  Appropriate  Thought Process:  Coherent  Orientation:  Full (Time, Place, and Person)  Thought  Content:  WDL  Suicidal Thoughts:  No  Homicidal Thoughts:  No  Memory:  Immediate;   Fair Recent;   Fair Remote;   Fair  Judgement:  Good  Insight:  Good  Psychomotor Activity:  Normal  Concentration:  Good  Recall:  Good  Fund of Knowledge:Good  Language: Good  Akathisia:  No  Handed:  Right  AIMS (if indicated):     Assets:  Communication Skills Desire for Improvement Resilience  Sleep:  Number of Hours: 5.75   Musculoskeletal: Strength & Muscle Tone: within normal limits Gait & Station: normal Patient leans: N/A  Current Medications: Current Facility-Administered Medications  Medication Dose Route Frequency Provider Last Rate Last Dose  . alum & mag hydroxide-simeth (MAALOX/MYLANTA) 200-200-20 MG/5ML suspension 30 mL  30 mL Oral Q4H PRN Waylan Boga, NP      . ARIPiprazole (ABILIFY) tablet 5 mg  5 mg Oral Daily Merian Capron, MD   5 mg at 01/29/14 0808  . benzocaine (ORAJEL) 10 % mucosal gel   Mouth/Throat QID PRN Benjamine Mola, FNP      . busPIRone (BUSPAR) tablet 5 mg  5 mg Oral BID Waylan Boga, NP   5 mg at 01/29/14 0808  . feeding supplement (ENSURE COMPLETE) (ENSURE COMPLETE) liquid 237 mL  237 mL Oral BID BM Dagmar Hait, RD   237 mL at 01/29/14 1155  . FLUoxetine (PROZAC) capsule 20 mg  20 mg Oral Daily Waylan Boga, NP   20 mg at 01/29/14 9924  . hydrOXYzine (ATARAX/VISTARIL)  tablet 25-50 mg  25-50 mg Oral QHS PRN Waylan Boga, NP   50 mg at 01/28/14 2113  . magnesium hydroxide (MILK OF MAGNESIA) suspension 30 mL  30 mL Oral Daily PRN Waylan Boga, NP      . nicotine (NICODERM CQ - dosed in mg/24 hours) patch 21 mg  21 mg Transdermal Daily Durward Parcel, MD   21 mg at 01/29/14 0800    Lab Results: No results found for this or any previous visit (from the past 54 hour(s)).  Physical Findings: AIMS: Facial and Oral Movements Muscles of Facial Expression: None, normal Lips and Perioral Area: None, normal Jaw: None, normal Tongue: None,  normal,Extremity Movements Upper (arms, wrists, hands, fingers): None, normal Lower (legs, knees, ankles, toes): None, normal, Trunk Movements Neck, shoulders, hips: None, normal, Overall Severity Severity of abnormal movements (highest score from questions above): None, normal Incapacitation due to abnormal movements: None, normal Patient's awareness of abnormal movements (rate only patient's report): No Awareness, Dental Status Current problems with teeth and/or dentures?: Yes (patient has several broken teeth) Does patient usually wear dentures?: No  CIWA:    COWS:     Treatment Plan Summary: Daily contact with patient to assess and evaluate symptoms and progress in treatment Medication management  Plan: Review of chart, vital signs, medications, and notes.  Patient states he is doing well and is planning d/c to home in the AM if continues to do well.  1-Individual and group therapy  2-Medication management for depression and anxiety: Medications reviewed with the patient and she stated no untoward effects, unchanged. 3-Coping skills for depression, anxiety  4-Continue crisis stabilization and management  5-Address health issues--monitoring vital signs, stable  6-Treatment plan in progress to prevent relapse of depression and anxiety  Medical Decision Making Problem Points:  Established problem, stable/improving (1), Review of last therapy session (1) and Review of psycho-social stressors (1) Data Points:  Review or order clinical lab tests (1) Review or order medicine tests (1) Review of medication regiment & side effects (2) Review of new medications or change in dosage (2)  I certify that inpatient services furnished can reasonably be expected to improve the patient's condition.  Lissa Hoard. Mashburn RPAC 1:34 PM 01/29/2014 Agree with assessment and plan Geralyn Flash A. Sabra Heck, M.D.

## 2014-01-29 NOTE — [facility] Group Notes (Signed)
[facility]   01/29/2014 8:45 AM  Participation Quality:  Alert, Appropriate and Oriented  Mood/Affect:  Bright  Depression Rating:  2  Anxiety Rating:  2  Thoughts of Suicide:  Pt denies SI/HI  Will you contract for safety?   Yes  Current AVH:  Pt denies  Plan for Discharge/Comments:  Pt attended discharge planning group and actively participated in group.  CSW provided pt with today's workbook.  Pt reports feeling well today.  Pt states that he plans to take meds as prescribed and continue to look for a job.  Pt will return home in [place].  Pt is open to referrals for outpatient treatment.  CSW will make appropriate referrals.  No further needs voiced by pt at this time.    Transportation Means: Pt reports access to transportation - friend will pick pt up  Supports: No supports mentioned at this time  Reyes IvanChelsea Horton, LCSW 01/29/2014 10:08 AM
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
## 2014-01-29 NOTE — [facility] Group Notes (Signed)
[facility] LCSW Group Therapy  01/29/2014   1:15 PM   Type of Therapy:  Group Therapy  Participation Level:  Active  Participation Quality:  Attentive, Sharing and Supportive  Affect:  Calm  Cognitive:  Alert and Oriented  Insight:  Developing/Improving and Engaged  Engagement in Therapy:  Developing/Improving and Engaged  Modes of Intervention:  Clarification, Confrontation, Discussion, Education, Exploration, Limit-setting, Orientation, Problem-solving, Rapport Building, Dance movement psychotherapisteality Testing, Socialization and Support  Summary of Progress/Problems: Pt identified obstacles faced currently and processed barriers involved in overcoming these obstacles. Pt identified steps necessary for overcoming these obstacles and explored motivation (internal and external) for facing these difficulties head on. Pt further identified one area of concern in their lives and chose a goal to focus on for today. Pt shared that his biggest obstacle is getting a job.  Pt explained that he feels that once he has a job he will be able to get transportation, stay on his meds and go back to school.  Pt actively participated and was engaged in group discussion.    Reyes IvanChelsea Horton, LCSW 01/29/2014  3:26 PM

## 2014-01-29 NOTE — Progress Notes (Signed)
Patient ID: Bryan Conway, male   DOB: 01-04-1976, 38 y.o.   MRN: [id number] D: pt. Reports "ready to go home" says being here has been helpful "It worked, feeling normal, came to the right place at the right time" A: Writer encouraged pt. To continue medications and put away copayment each month, also encouraged therapist for support. Staff encouraged group, staff will monitor q3115min for safety. R: pt. Is safe on the unit, attended group.

## 2014-01-29 NOTE — Progress Notes (Signed)
Patient has been up and active on the unit, playing cards with peers in the dayroom. He has been laughing and joking with peers. He is compliant with peers and reports that the visteril that he takes at night for sleep works well for him. He denies si/hi/a/v hallucinations. Support and encouragement given. Safety maintained on unit with 15 min checks.

## 2014-01-29 NOTE — Progress Notes (Signed)
Adult Psychoeducational Group Note  Date:  01/29/2014 Time:  9:01 PM  Group Topic/Focus:  Wrap-Up Group:   The focus of this group is to help patients review their daily goal of treatment and discuss progress on daily workbooks.  Participation Level:  Active  Participation Quality:  Appropriate  Affect:  Appropriate and Excited  Cognitive:  Appropriate  Insight: Appropriate  Engagement in Group:  Engaged  Modes of Intervention:  Support  Additional Comments:  Pt stated that positive thing that happened was that he was able to play basketball today and got to go outside.   Tylar Amborn 01/29/2014, 9:01 PM

## 2014-01-29 NOTE — [facility] Suicide Risk Assessment (Signed)
[facility] INPATIENT:  Family/Significant Other Suicide Prevention Education  Suicide Prevention Education:  Education Completed; Bryan Conway - sister [phone]),  (name of family member/significant other) has been identified by the patient as the family member/significant other with whom the patient will be residing, and identified as the person(s) who will aid the patient in the event of a mental Bryan crisis (suicidal ideations/suicide attempt).  With written consent from the patient, the family member/significant other has been provided the following suicide prevention education, prior to the and/or following the discharge of the patient.  The suicide prevention education provided includes the following:  Suicide risk factors  Suicide prevention and interventions  National Suicide Hotline telephone number  [facility] assessment telephone number  Bryan Conway Emergency Assistance 911  Bryan Conway and/or Residential Mobile Crisis Unit telephone number  Request made of family/significant other to:  Remove weapons (e.g., guns, rifles, knives), all items previously/currently identified as safety concern.    Remove drugs/medications (over-the-counter, prescriptions, illicit drugs), all items previously/currently identified as a safety concern.  The family member/significant other verbalizes understanding of the suicide prevention education information provided.  The family member/significant other agrees to remove the items of safety concern listed above.  Bryan Conway Bryan Conway 01/29/2014, 2:45 PM

## 2014-01-30 MED ORDER — ARIPIPRAZOLE 5 MG PO TABS: 5.0000 mg | ORAL_TABLET | Freq: Every day | ORAL | Status: AC

## 2014-01-30 MED ORDER — BENZOCAINE 10 % MT GEL: Freq: Four times a day (QID) | OROMUCOSAL | Status: AC | PRN

## 2014-01-30 MED ORDER — FLUOXETINE HCL 20 MG PO CAPS: 20.0000 mg | ORAL_CAPSULE | Freq: Every day | ORAL | Status: AC

## 2014-01-30 MED ORDER — BUSPIRONE HCL 5 MG PO TABS: 5.0000 mg | ORAL_TABLET | Freq: Two times a day (BID) | ORAL | Status: AC

## 2014-01-30 NOTE — Progress Notes (Signed)
[facility] :  Will you be returning to the same living situation after discharge: Yes,  returning home At discharge, do you have transportation home?:Yes,  girlfriend will pick pt up Do you have the ability to pay for your medications:Yes,  provided pt with prescriptions and pt verbalizes ability to afford meds  Release of information consent forms completed and in the chart;  Patient's signature needed at discharge.  Patient to Follow up at: Follow-up Information   [facility] On 01/31/2014. (Resume PSR program - Monday - Friday 10 am - 3 pm.  Also scheduled to see your therapist on Mondays and Wednesdays at 9:00 am.  )    Contact information:   [address], [address] [place], [place] [address] Phone: [phone] Fax: [phone]       Follow up with [facility]. (Voicemail left to confirm follow up appointment.  [facility] will assist you with getting to this appointment.  )    Contact information:   [address], [address] [place], [place] [address] Phone: [phone] Fax: [phone]       Patient denies SI/HI:   Yes,  denies SI/HI    Safety Planning and Suicide Prevention discussed:  Yes,  discussed with pt and pt's sister.  See suicide prevention education note.   Yasheka Fossett N Horton 01/30/2014, 10:03 AM

## 2014-01-30 NOTE — Progress Notes (Signed)
Discharge note: pt received both written and verbal discharge instructions. Pt agreed to f/u appt and med regime. Pt denies SI/HI/AVH at this time. Pt received sample meds and prescriptions. Pt received belongings from room and locker. Pt safely left [facility].

## 2014-01-30 NOTE — Progress Notes (Signed)
Recreation Therapy Notes  Animal-Assisted Activity/Therapy (AAA/T) Program Checklist/Progress Notes Patient Eligibility Criteria Checklist & Daily Group note for Rec Tx Intervention  Date: 04.28.2015 Time: 2:45pm Location: [address] Dayroom    AAA/T Program Assumption of Risk Form signed by Patient/ or Parent Legal Guardian yes  Patient is free of allergies or sever asthma yes  Patient reports no fear of animals yes  Patient reports no history of cruelty to animals yes   Patient understands his/her participation is voluntary yes  Patient washes hands before animal contact yes  Patient washes hands after animal contact yes  Behavioral Response: Appropriate   Education: Hand Washing, Appropriate Animal Interaction   Education Outcome: Acknowledges understanding  Clinical Observations/Feedback: Patient interacted appropriately with therapy dog and staff.   Joyel Chenette L Lanyla Costello, LRT/CTRS  Adleigh Mcmasters L Derrin Currey 01/30/2014 4:34 PM 

## 2014-01-30 NOTE — [facility] Group Notes (Signed)
[facility] LCSW Group Therapy  01/30/2014   1:15 PM   Type of Therapy:  Group Therapy  Participation Level:  Active  Participation Quality:  Attentive, Sharing and Supportive  Affect:  Calm  Cognitive:  Alert and Oriented  Insight:  Developing/Improving and Engaged  Engagement in Therapy:  Developing/Improving and Engaged  Modes of Intervention:  Activity, Clarification, Confrontation, Discussion, Education, Exploration, Limit-setting, Orientation, Problem-solving, Rapport Building, Dance movement psychotherapisteality Testing, Socialization and Support  Summary of Progress/Problems: Patient was attentive and engaged with speaker from [employer].  Patient was attentive to speaker while they shared their story of dealing with mental health and overcoming it.  Patient expressed interest in their programs and services and received information on their agency.  Patient processed ways they can relate to the speaker.     Mivaan Corbitt Horton, LCSW 01/30/2014  1:32 PM

## 2014-01-30 NOTE — [facility] Suicide Risk Assessment (Signed)
Suicide Risk Assessment  Discharge Assessment     Demographic Factors:  Male  Total Time spent with patient: 45 minutes  Psychiatric Specialty Exam:     Blood pressure 136/85, pulse 87, temperature 97.7 F (36.5 C), temperature source Oral, resp. rate 18, height 5\' 10"  (1.778 m), weight 58.514 kg (129 lb).Body mass index is 18.51 kg/(m^2).  General Appearance: Fairly Groomed  Patent attorneyye Contact::  Fair  Speech:  Clear and Coherent  Volume:  Decreased  Mood:  Euthymic  Affect:  Appropriate  Thought Process:  Coherent and Goal Directed  Orientation:  Full (Time, Place, and Person)  Thought Content:  plans as he moves on  Suicidal Thoughts:  No  Homicidal Thoughts:  No  Memory:  Immediate;   Fair Recent;   Fair Remote;   Fair  Judgement:  Fair  Insight:  Present  Psychomotor Activity:  Decreased  Concentration:  Fair  Recall:  [employer]Fair  Fund of Knowledge:NA  Language: Fair  Akathisia:  No  Handed:    AIMS (if indicated):     Assets:  Desire for Improvement  Sleep:  Number of Hours: 6.75    Musculoskeletal: Strength & Muscle Tone: within normal limits Gait & Station: normal Patient leans: N/A   Mental Status Per Nursing Assessment::   On Admission:      Current Mental Status by Physician: In full contact with reality. There are no active SI plans or intent. Willing and motivated to pursue outpatient follow up    Loss Factors: NA  Historical Factors: NA  Risk Reduction Factors:   Sense of responsibility to family and Positive social support  Continued Clinical Symptoms:  Depression:   Insomnia  Cognitive Features That Contribute To Risk:  Closed-mindedness Polarized thinking Thought constriction (tunnel vision)    Suicide Risk:  Minimal: No identifiable suicidal ideation.  Patients presenting with no risk factors but with morbid ruminations; may be classified as minimal risk based on the severity of the depressive symptoms  Discharge Diagnoses:   AXIS I:   Major Depression recurrent severe AXIS II:  No diagnosis AXIS III:   Past Medical History  Diagnosis Date  . Ulcer   . Bipolar disorder   . Schizophrenia    AXIS IV:  other psychosocial or environmental problems AXIS V:  61-70 mild symptoms  Plan Of Care/Follow-up recommendations:  Activity:  as tolerated Diet:  regular Follow up outpatient basis Is patient on multiple antipsychotic therapies at discharge:  No   Has Patient had three or more failed trials of antipsychotic monotherapy by history:  No  Recommended Plan for Multiple Antipsychotic Therapies: NA    Rachael FeeIrving A Talyn Dessert 01/30/2014, 12:07 PM

## 2014-02-02 NOTE — Progress Notes (Signed)
Patient Discharge Instructions:  After Visit Summary (AVS):   Faxed to:  02/02/14 Psychiatric Admission Assessment Note:   Faxed to:  02/02/14 Suicide Risk Assessment - Discharge Assessment:   Faxed to:  02/02/14 Faxed/Sent to the Next Level Care provider:  02/02/14 [facility] to [facility] @ [phone] Faxed to [facility] @ [phone]  Jerelene ReddenSheena E Narcissa, 02/02/2014, 3:24 PM

## 2014-02-11 NOTE — Discharge Summary (Signed)
Physician Discharge Summary Note  Patient:  Bryan Conway is an 38 y.o., male MRN:  [id number] DOB:  1976/06/30 Patient phone:  [phone] (home)  Patient address:   [address] [place] [address],  Total Time spent with patient: 30 minutes  Date of Admission:  01/26/2014 Date of Discharge: 01/30/2014  Reason for Admission:  MDD with SI  Discharge Diagnoses: Active Problems:   Major depression   Psychiatric Specialty Exam: Physical Exam  ROS  Blood pressure 136/85, pulse 87, temperature 97.7 F (36.5 C), temperature source Oral, resp. rate 18, height 5\' 10"  (1.778 m), weight 58.514 kg (129 lb).Body mass index is 18.51 kg/(m^2).   General Appearance: Fairly Groomed  Patent attorneyye Contact::  Fair  Speech:  Clear and Coherent  Volume:  Decreased  Mood:  Euthymic  Affect:  Appropriate  Thought Process:  Coherent and Goal Directed  Orientation:  Full (Time, Place, and Person)  Thought Content:  plans as he moves on  Suicidal Thoughts:  No  Homicidal Thoughts:  No  Memory:  Immediate;   Fair Recent;   Fair Remote;   Fair  Judgement:  Fair  Insight:  Present  Psychomotor Activity:  Decreased  Concentration:  Fair  Recall:  [employer]  Fund of Knowledge:NA  Language: Fair  Akathisia:  No  Handed:    AIMS (if indicated):     Assets:  Desire for Improvement  Sleep:  Number of Hours: 6.75    Musculoskeletal: Strength & Muscle Tone: within normal limits Gait & Station: normal Patient leans: N/A  DSM5: Depressive Disorders:  Major Depressive Disorder - Severe (296.23)  Axis Diagnosis:   AXIS I:  Major Depression, Recurrent severe AXIS II:  Deferred AXIS III:   Past Medical History  Diagnosis Date  . Ulcer   . Bipolar disorder   . Schizophrenia    AXIS IV:  other psychosocial or environmental problems and problems related to social environment AXIS V:  61-70 mild symptoms  Level of Care:  OP  Hospital Course:  Presented to ED with depression, withdrawn and feeling  suicidal. Says Mom died 4 years ago. Dad died 4 months ago. He was living with his girlfriend but felt unsafe and also hearing voices. Denies using marijuna recently but was found positive in urine.  He felt unsafe and called for help to get to hospital. There was also concern that he drank bleach which he denies and has taken overdose on few pills. He states the voices were making him to do that but remained unclear if he took tablets. Endorses feeling anhedonia, distracted and withdrawn. No panic symptoms or visual hallucinations.   Consults:  None  Significant Diagnostic Studies:  None  Discharge Vitals:   Blood pressure 136/85, pulse 87, temperature 97.7 F (36.5 C), temperature source Oral, resp. rate 18, height 5\' 10"  (1.778 m), weight 58.514 kg (129 lb). Body mass index is 18.51 kg/(m^2). Lab Results:   No results found for this or any previous visit (from the past 72 hour(s)).  Physical Findings: AIMS: Facial and Oral Movements Muscles of Facial Expression: None, normal Lips and Perioral Area: None, normal Jaw: None, normal Tongue: None, normal,Extremity Movements Upper (arms, wrists, hands, fingers): None, normal Lower (legs, knees, ankles, toes): None, normal, Trunk Movements Neck, shoulders, hips: None, normal, Overall Severity Severity of abnormal movements (highest score from questions above): None, normal Incapacitation due to abnormal movements: None, normal Patient's awareness of abnormal movements (rate only patient's report): No Awareness, Dental Status Current problems with  teeth and/or dentures?: Yes (patient has several broken teeth) Does patient usually wear dentures?: No  CIWA:    COWS:     Psychiatric Specialty Exam: See Psychiatric Specialty Exam and Suicide Risk Assessment completed by Attending Physician prior to discharge.  Discharge destination:  Home  Is patient on multiple antipsychotic therapies at discharge:  No   Has Patient had three or more  failed trials of antipsychotic monotherapy by history:  No  Recommended Plan for Multiple Antipsychotic Therapies: NA     Medication List    STOP taking these medications       hydrOXYzine 25 MG tablet  Commonly known as:  ATARAX/VISTARIL      TAKE these medications     Indication   ARIPiprazole 5 MG tablet  Commonly known as:  ABILIFY  Take 1 tablet (5 mg total) by mouth daily.   Indication:  mood stabilization     benzocaine 10 % mucosal gel  Commonly known as:  ORAJEL  Use as directed in the mouth or throat 4 (four) times daily as needed for mouth pain.      busPIRone 5 MG tablet  Commonly known as:  BUSPAR  Take 1 tablet (5 mg total) by mouth 2 (two) times daily.   Indication:  mood stabilization     FLUoxetine 20 MG capsule  Commonly known as:  PROZAC  Take 1 capsule (20 mg total) by mouth daily.   Indication:  mood stabilization           Follow-up Information   [facility]. (Resume PSR program - Monday - Friday 10 am - 3 pm.  Also scheduled to see your therapist on Mondays and Wednesdays at 9:00 am.  )    Contact information:   [address], [address] [place], [place] [address] Phone: [phone] Fax: [phone]       Follow up with [facility]. (Voicemail left to confirm follow up appointment.  [facility] will assist you with getting to this appointment.  )    Contact information:   [address] [place], [place] [address] Phone: [phone] Fax: [phone]       Follow-up recommendations:  Activity:  As tolerated Diet:  Heart healthy with low sodium.  Comments:   Take all medications as prescribed. Keep all follow-up appointments as scheduled.  Do not consume alcohol or use illegal drugs while on prescription medications. Report any adverse effects from your medications to your primary care provider promptly.  In the event of recurrent symptoms or worsening  symptoms, call 911, a crisis hotline, or go to the nearest emergency department for evaluation.   Total Discharge Time:  Greater than 30 minutes.  Signed: Beau FannyJohn C Withrow, FNP-BC 01/30/2014, 11:41 AM Personally evaluated the patient and agree with assessment and plan Madie RenoIrving A. Dub MikesLugo, M.D.

## 2015-03-27 ENCOUNTER — Emergency Department (HOSPITAL_COMMUNITY)
Admission: EM | Admit: 2015-03-27 | Discharge: 2015-03-27 | Disposition: A | Discharge status: Home / Self Care | Payer: Medicaid Other | Attending: Emergency Medicine | Admitting: Emergency Medicine

## 2015-03-27 ENCOUNTER — Encounter (HOSPITAL_COMMUNITY): Payer: Self-pay

## 2015-03-27 DIAGNOSIS — Z872 Personal history of diseases of the skin and subcutaneous tissue: Secondary | ICD-10-CM | POA: Insufficient documentation

## 2015-03-27 DIAGNOSIS — K088 Other specified disorders of teeth and supporting structures: Secondary | ICD-10-CM | POA: Diagnosis not present

## 2015-03-27 DIAGNOSIS — F209 Schizophrenia, unspecified: Secondary | ICD-10-CM | POA: Insufficient documentation

## 2015-03-27 DIAGNOSIS — Z720 Tobacco use: Secondary | ICD-10-CM | POA: Diagnosis not present

## 2015-03-27 DIAGNOSIS — K029 Dental caries, unspecified: Secondary | ICD-10-CM | POA: Diagnosis not present

## 2015-03-27 DIAGNOSIS — K0381 Cracked tooth: Secondary | ICD-10-CM | POA: Insufficient documentation

## 2015-03-27 DIAGNOSIS — R441 Visual hallucinations: Secondary | ICD-10-CM

## 2015-03-27 DIAGNOSIS — F319 Bipolar disorder, unspecified: Secondary | ICD-10-CM | POA: Diagnosis not present

## 2015-03-27 DIAGNOSIS — R110 Nausea: Secondary | ICD-10-CM | POA: Diagnosis not present

## 2015-03-27 DIAGNOSIS — Z79899 Other long term (current) drug therapy: Secondary | ICD-10-CM | POA: Insufficient documentation

## 2015-03-27 DIAGNOSIS — Z008 Encounter for other general examination: Secondary | ICD-10-CM | POA: Diagnosis present

## 2015-03-27 DIAGNOSIS — R440 Auditory hallucinations: Secondary | ICD-10-CM

## 2015-03-27 DIAGNOSIS — R454 Irritability and anger: Secondary | ICD-10-CM

## 2015-03-27 HISTORY — DX: Major depressive disorder, single episode, unspecified: F32.9

## 2015-03-27 HISTORY — DX: Depression, unspecified: F32.A

## 2015-03-27 MED ORDER — IBUPROFEN 600 MG PO TABS: 600.0000 mg | ORAL_TABLET | Freq: Four times a day (QID) | ORAL | Status: AC | PRN

## 2015-03-27 MED ORDER — PENICILLIN V POTASSIUM 500 MG PO TABS: 500.0000 mg | ORAL_TABLET | Freq: Three times a day (TID) | ORAL | Status: AC

## 2015-03-27 NOTE — ED Provider Notes (Signed)
CSN: [id number]     Arrival date & time 03/27/15  1020 History   First MD Initiated Contact with Patient 03/27/15 1044     Chief Complaint  Patient presents with  . Medical Clearance     (Consider location/radiation/quality/duration/timing/severity/associated sxs/prior Treatment) HPI Pt is a 39yo male with hx of bipolar disorder, schizophrenia, and depression, brought to ED by his sister with reports of pt having issues with anger outbursts with paranoia, thinking people are out to harm him and insomnia.  Majority of history provided by sister as pt does not want to be in ED for his psych problems.  Pt states he has auditory and visual hallucinations but will not go into detail.  When asked about drugs or alcohol, pt denies but child in the room states "uh huh."  When asked if pt has thought of hurting himself or others, pt states "not right now."  Sister states he was in a program for his behavior problems 6-7 months ago and was doing well on medications but he was kicked out and was not able to find additional psychiatric care.   Pt's sister also points out that pt has been complaining of tooth pain for months.  Pt also c/o dental pain that has been waxing and waning for years, pt is requesting pain medication and to have his teeth pulled. Pain is aching, sore, throbbing, 9/10 OTC medication does not help with pain.  Reports associated nausea. Denies CP or SOB. Denies difficulty breathing or swallowing. Pt does not have a dentist or PCP.  Past Medical History  Diagnosis Date  . Ulcer   . Bipolar disorder   . Schizophrenia   . Depression    Past Surgical History  Procedure Laterality Date  . Abdominal surgery     Family History  Problem Relation Age of Onset  . Hypothyroidism Sister    History  Substance Use Topics  . Smoking status: Current Every Day Smoker -- 0.25 packs/day for 15 years    Types: Cigarettes  . Smokeless tobacco: Never Used  . Alcohol Use: No    Review of  Systems  HENT: Positive for dental problem. Negative for sore throat, trouble swallowing and voice change.   Respiratory: Negative for shortness of breath.   Cardiovascular: Negative for chest pain and palpitations.  Gastrointestinal: Positive for nausea. Negative for vomiting, abdominal pain and diarrhea.  Psychiatric/Behavioral: Positive for hallucinations, behavioral problems, sleep disturbance and agitation. Negative for suicidal ideas, confusion and self-injury. The patient is not nervous/anxious.   All other systems reviewed and are negative.     Allergies  Review of patient's allergies indicates no known allergies.  Home Medications   Prior to Admission medications   Medication Sig Start Date End Date Taking? Authorizing Provider  ARIPiprazole (ABILIFY) 5 MG tablet Take 1 tablet (5 mg total) by mouth daily. 01/30/14  Yes Beau Fanny, FNP  benzocaine (ORAJEL) 10 % mucosal gel Use as directed in the mouth or throat 4 (four) times daily as needed for mouth pain. 01/30/14  Yes Beau Fanny, FNP  busPIRone (BUSPAR) 5 MG tablet Take 1 tablet (5 mg total) by mouth 2 (two) times daily. 01/30/14  Yes Beau Fanny, FNP  FLUoxetine (PROZAC) 20 MG capsule Take 1 capsule (20 mg total) by mouth daily. 01/30/14  Yes Beau Fanny, FNP  ibuprofen (ADVIL,MOTRIN) 600 MG tablet Take 1 tablet (600 mg total) by mouth every 6 (six) hours as needed. 03/27/15   Junius Finner, PA-C  penicillin v potassium (VEETID) 500 MG tablet Take 1 tablet (500 mg total) by mouth 3 (three) times daily. 03/27/15 04/03/15  Junius Finner, PA-C   BP 129/91 mmHg  Pulse 89  Temp(Src) 98.2 F (36.8 C) (Oral)  Resp 16  SpO2 98% Physical Exam  Constitutional: He is oriented to person, place, and time. He appears well-developed and well-nourished.  HENT:  Head: Normocephalic and atraumatic.  Nose: Nose normal.  Mouth/Throat: Uvula is midline, oropharynx is clear and moist and mucous membranes are normal. No trismus in the  jaw. Abnormal dentition. Dental caries present. No dental abscesses.  Multiple dental caries and dental decay on Left side.  No gingival abscess.  Eyes: EOM are normal.  Neck: Normal range of motion.  Cardiovascular: Normal rate.   Pulmonary/Chest: Effort normal. No respiratory distress.  Abdominal: Soft. There is no tenderness.  Musculoskeletal: Normal range of motion.  Neurological: He is alert and oriented to person, place, and time.  Skin: Skin is warm and dry.  Psychiatric: He has a normal mood and affect. His behavior is normal.  Nursing note and vitals reviewed.   ED Course  Procedures (including critical care time) Labs Review Labs Reviewed - No data to display  Imaging Review No results found.   EKG Interpretation None      MDM   Final diagnoses:  Outbursts of anger  Auditory hallucination  Visual hallucination  Pain due to dental caries   Pt is a 39yo male with hx of bipolar disorder, schizophrenia and depression brought to ED by sister to restart his psych meds. Pt c/o dental pain. Pt denies SI/HI.  Discussed pt with TTS who also examined pt.  Will provide community resources for PCP, dentist as well as behavioral health, specifically [facility].  Rx: PCN and ibuprofen. As pt has not have his psychiatric medications for up to 7 months, do not feel comfortable refilling pt's psychiatric medications without him having proper f/u.  Pt will benefit most from f/u with [facility].  Pt does not appear to be a danger to himself or otherwise at this time. IVC not indicated.  Return precautions provided.    Junius Finner, PA-C 03/27/15 [address] PA-C 03/27/15 1148  Benjiman Core, MD 03/27/15 [phone]

## 2015-03-27 NOTE — BH Assessment (Signed)
Assessment Note  Bryan Conway is an 39 y.o. male with history of Bipolar Disorder, Schizophrenia, and Depression. Patient presents to [facility] today for medications. Sts, "I haven't been on my medications for 1 year". Patient is not sure what medications he was taking a year ago. He reports being kicked out of a program and not being able to get back on his medications. Patient also unable to recall the name of the program in which he was receiving services. Patient denies SI and HI. Patient has a history of one suicide attempt (overdose). Patient denies self mutilating behaviors. Patient was hospitalized at [facility] for this suicide attempt in addition to substance abuse, depression, and auditory hallucinations.Patient's sister sitting at bedside during the assessment. Patient brought to ED by his sister with reports that pt is having issues with anger outbursts with paranoia, thinking people are out to harm him and insomnia. Patient admits that he has AH's of voices, "Talking junk to me". Patient feels that the voices have worsened the the past 2-3 weeks. He denies that the [facility] are command type.   Writer discussed plan of care with PA-C Junius Finner). Patient Bryan Conway has declined current inpatient treatment. Patient agreeable to follow up with an outpatient provider. Patient has a follow up appointment today (see disposition below for details regarding follow up appointment).  Axis I: Bipolar Disorder, Schizophrenia, and Depression Axis II: Deferred Axis III:  Past Medical History  Diagnosis Date  . Ulcer   . Bipolar disorder   . Schizophrenia   . Depression    Axis IV: other psychosocial or environmental problems, problems related to social environment, problems with access to health care services and problems with primary support group Axis V: 31-40 impairment in reality testing  Past Medical History:  Past Medical History  Diagnosis Date  . Ulcer   . Bipolar disorder   . Schizophrenia   .  Depression     Past Surgical History  Procedure Laterality Date  . Abdominal surgery      Family History:  Family History  Problem Relation Age of Onset  . Hypothyroidism Sister     Social History:  reports that he has been smoking Cigarettes.  He has a 3.75 pack-year smoking history. He has never used smokeless tobacco. He reports that he does not drink alcohol or use illicit drugs.  Additional Social History:  Alcohol / Drug Use Pain Medications: SEE MAR Prescriptions: SEE MAR; "I have bee off my medications for several months" Over the Counter: SEE MAR History of alcohol / drug use?: Yes Substance #1 Name of Substance 1: THC 1 - Age of First Use: 39 yrs old  1 - Amount (size/oz): 4-5 blunts daily  1 - Frequency: daily 1 - Duration: on-going 1 - Last Use / Amount: 2-3 months ago  Substance #2 Name of Substance 2: Alcohol -liqour and beer 2 - Age of First Use: 39 yrs old  2 - Amount (size/oz): 1/2 a pint 2 - Frequency: daily  2 - Duration: on-going  2 - Last Use / Amount: 2-3 months ago  CIWA: CIWA-Ar BP: 129/91 mmHg Pulse Rate: 89 COWS:    Allergies: No Known Allergies  Home Medications:  (Not in a hospital admission)  OB/GYN Status:  No LMP for male patient.  General Assessment Data [facility]: [facility] Is this a Tele or Face-to-Face Assessment?: Face-to-Face Is this an Initial Assessment or a Re-assessment for this encounter?: Initial Assessment Marital status: Single Maiden name:  (n/a)  Is patient pregnant?: No Pregnancy Status: No Living Arrangements: Spouse/significant other, Other (Comment), Children (girlfriend and adult children in the home) Can pt return to current living arrangement?: Yes Admission Status: Voluntary Is patient capable of signing voluntary admission?: Yes Referral Source: Self/Family/Friend Insurance type:  (Medicaid)     Crisis Care Plan Living Arrangements: Spouse/significant other, Other (Comment), Children  (girlfriend and adult children in the home) Name of Psychiatrist:  (No psychiatrist ) Name of Therapist:  (No therapist )  Education Status Is patient currently in school?: No  Risk to self with the past 6 months Suicidal Ideation: No Has patient been a risk to self within the past 6 months prior to admission? : No Suicidal Intent: No Has patient had any suicidal intent within the past 6 months prior to admission? : No Is patient at risk for suicide?: No Suicidal Plan?: No Has patient had any suicidal plan within the past 6 months prior to admission? : No Access to Means: No What has been your use of drugs/alcohol within the last 12 months?:  (n/a) Previous Attempts/Gestures: Yes How many times?:  (1x- overdosed) Other Self Harm Risks:  (none reported) Triggers for Past Attempts: Other (Comment) ("I had stuff on my mind and didn't have my medications") Intentional Self Injurious Behavior: None Family Suicide History: No Recent stressful life event(s): Other (Comment) (parents deceased (mother died 63yr ago/father 3 yrs ago)) Persecutory voices/beliefs?: No Depression: Yes Depression Symptoms: Feeling angry/irritable, Feeling worthless/self pity, Loss of interest in usual pleasures, Guilt, Fatigue, Isolating, Tearfulness, Insomnia, Despondent Substance abuse history and/or treatment for substance abuse?: No Suicide prevention information given to non-admitted patients: Not applicable  Risk to Others within the past 6 months Homicidal Ideation: No-Not Currently/Within Last 6 Months Does patient have any lifetime risk of violence toward others beyond the six months prior to admission? : No Thoughts of Harm to Others: No-Not Currently Present/Within Last 6 Months Current Homicidal Intent: No Current Homicidal Plan: No Access to Homicidal Means: No Identified Victim:  (n/a) History of harm to others?: No Assessment of Violence: None Noted Violent Behavior Description:  (patient  currently calm and cooperative ) Does patient have access to weapons?: No Criminal Charges Pending?: No Does patient have a court date: No Is patient on probation?: No  Psychosis Hallucinations: Auditory ("I hear people saying disrespectful things to me") Delusions: Unspecified ("I feel like people are staring at me and out to get me")  Mental Status Report Appearance/Hygiene: Disheveled Eye Contact: Good Motor Activity: Freedom of movement Speech: Logical/coherent Level of Consciousness: Alert Mood: Depressed Affect: Appropriate to circumstance Anxiety Level: Minimal Thought Processes: Coherent, Relevant Judgement: Unimpaired Orientation: Person, Place, Time, Situation Obsessive Compulsive Thoughts/Behaviors: None  Cognitive Functioning Concentration: Decreased Memory: Recent Intact, Remote Intact IQ: Average Insight: Fair Impulse Control: Fair Appetite: Fair Weight Loss:  (30 pounds 1 month) Weight Gain:  (none reported) Sleep: Decreased Total Hours of Sleep:  (5 hrs per night ) Vegetative Symptoms: None  ADLScreening [facility] Assessment Services) Patient's cognitive ability adequate to safely complete daily activities?: Yes Patient able to express need for assistance with ADLs?: Yes Independently performs ADLs?: Yes (appropriate for developmental age)  Prior Inpatient Therapy Prior Inpatient Therapy: Yes Prior Therapy Dates:  (01/26/2014) Prior Therapy Facilty/Provider(s):  [facility]) Reason for Treatment:  (depression, hearing voices, suicide attempt)  Prior Outpatient Therapy Prior Outpatient Therapy: No Prior Therapy Dates:  (n/a) Prior Therapy Facilty/Provider(s):  (n/a) Reason for Treatment:  (n/a) Does patient have an ACCT team?: No Does patient  have Intensive In-House Services?  : No Does patient have [facility] services? : No Does patient have P4CC services?: No  ADL Screening (condition at time of admission) Patient's cognitive ability adequate to safely  complete daily activities?: Yes Is the patient deaf or have difficulty hearing?: No Does the patient have difficulty seeing, even when wearing glasses/contacts?: No Does the patient have difficulty concentrating, remembering, or making decisions?: No Patient able to express need for assistance with ADLs?: Yes Does the patient have difficulty dressing or bathing?: No Independently performs ADLs?: Yes (appropriate for developmental age) Does the patient have difficulty walking or climbing stairs?: No Weakness of Legs: None Weakness of Arms/Hands: None  Home Assistive Devices/Equipment Home Assistive Devices/Equipment: None          Advance Directives (For Healthcare) Does patient have an advance directive?: No Would patient like information on creating an advanced directive?: No - patient declined information    Additional Information 1:1 In Past 12 Months?: No CIRT Risk: No Elopement Risk: No Does patient have medical clearance?: Yes     Disposition:  Disposition Initial Assessment Completed for this Encounter: Yes Disposition of Patient: Other dispositions, Outpatient treatment Larna Daughters, PA-C and this Clinical research associate discussed plan of care) Type of outpatient treatment: Adult Vesta Mixer, [facility], [place], Catering manager. )   Patient's Appointment is scheduled for Wednesday, March 27, 2015 4pm and 8pm [facility] [employer] [address] [place], [place] [address] [phone] (Intake appointment 4pm with Quadra and 8pm with Dr. Penelope Galas)    On Site Evaluation by:   Reviewed with Physician:    Melynda Ripple [facility] 03/27/2015 11:59 AM

## 2015-03-27 NOTE — ED Notes (Signed)
Patient states he has been out of his medications for months. Patient's sister states that the patient was in a program and they stopped seeing the patient and it has been 6-7 months since he has had meds or treatment. Patient's sister states that the patient has angry outburst , paranoia (thinks people are out to harm him), and insomnia. Patient denies SI/HI. Patient states at times he has auditory and visual hallucinations.

## 2015-03-27 NOTE — Discharge Instructions (Signed)
°Emergency Department Resource Guide °1) Find a Doctor and Pay Out of Pocket °Although you won't have to find out who is covered by your insurance plan, it is a good idea to ask around and get recommendations. You will then need to call the office and see if the doctor you have chosen will accept you as a new patient and what types of options they offer for patients who are self-pay. Some doctors offer discounts or will set up payment plans for their patients who do not have insurance, but you will need to ask so you aren't surprised when you get to your appointment. ° °2) Contact Your Local Health Department °Not all health departments have doctors that can see patients for sick visits, but many do, so it is worth a call to see if yours does. If you don't know where your local health department is, you can check in your phone book. The CDC also has a tool to help you locate your state's health department, and many state websites also have listings of all of their local health departments. ° °3) Find a Walk-in Clinic °If your illness is not likely to be very severe or complicated, you may want to try a walk in clinic. These are popping up all over the country in pharmacies, drugstores, and shopping centers. They're usually staffed by nurse practitioners or physician assistants that have been trained to treat common illnesses and complaints. They're usually fairly quick and inexpensive. However, if you have serious medical issues or chronic medical problems, these are probably not your best option. ° °No Primary Care Doctor: °- Call Health Connect at  832-8000 - they can help you locate a primary care doctor that  accepts your insurance, provides certain services, etc. °- Physician Referral Service- 1-800-533-3463 ° °Chronic Pain Problems: °Organization         Address  Phone   Notes  °[facility]  (336) 297-2271 Patients need to be referred by their primary care doctor.  ° °Medication  Assistance: °Organization         Address  Phone   Notes  °[place] [facility] [address] °[place], [place] [address] (336) 641-8030 --Must be a resident of [place] °-- Must have NO insurance coverage whatsoever (no Medicaid/ Medicare, etc.) °-- The pt. MUST have a primary care doctor that directs their care regularly and follows them in the community °  °[facility]  (866) 331-1348   °[employer]  (888) 892-1162   ° °Agencies that provide inexpensive medical care: °Organization         Address  Phone   Notes  °[facility]  (336) 832-8035   °[facility]    (336) 832-7272   °[facility] [address] °[place],  [address] (336) 832-4777   °[facility] [address], °[place] (336) 271-4999   °[facility]    (336) 373-0678   °[facility]    (336) 272-1050   °[facility] ° [address], [place] Phone:  (336) 832-4444, Fax:  (336) 832-4440 Hours of Operation:  9 am - 6 pm, M-F.  Also accepts Medicaid/Medicare and self-pay.  °[facility] ° [address],  Phone: (336) 832-3150, Fax: (336) 832-3151. Hours of Operation:  8:30 am - 5:30 pm, M-F.  Also accepts Medicaid and self-pay.  °[facility] [place] [address], [place] Phone: (336) 878-6027   °[facility] [address], [place], [place] (336)723-1848, Ext. 123 Mondays & Thursdays: 7-9 AM.  First 15 patients are seen on a first come, first serve basis. °  ° °Medicaid-accepting [place] Providers: ° °Organization         Address  Phone   Notes  °[facility] [address], [place] (336) 641-2100 Also accepts self-pay patients.  °[facility] [address], [place] ° (336) 856-9996   °[facility] [address], [place]  (336) 288-8857   °[facility] [address] [place] [address], [place] (336) 299-7000   °Veita Bland [address], [place]  ° (336) 373-1557 Only accepts [place] Access Medicaid patients after they have their name applied to their card.  ° °Self-Pay (no insurance) in [place]: ° °Organization         Address  Phone   Notes  °Sickle Cell Patients, [facility] [address], [place] (336) 832-1970   °[facility] [address], [place] (336) 832-4400   °[facility] ° [address], [place] (336) 992-4800   °[facility]/Dr. Osei-Bonsu ° [address] [place] [address], [place] or [address], [place] (336) 841-8500 Phone number for both [place] and [place] locations is the same.  °[facility] [address], [place] (336) 299-0000   °[facility] [address] [place] [address], [place] or [address] (336) 852-7530 °(336) 878-2260   °[facility] [address],  (336) 350-1642, phone; (336) 294-5005, fax Sees patients 1st and 3rd Saturday of every month.  Must not qualify for public or private insurance (i.e. Medicaid, Medicare, [place] Health Choice, Veterans' Benefits) • Household income should be no more than 200% of the poverty level •The clinic cannot treat you if you are pregnant or think you are pregnant • Sexually transmitted diseases are not treated at the clinic.  ° ° °Dental Care: °Organization         Address  Phone  Notes  °[place] [facility] [address],  (336) 641-6152 Accepts children up to age 21 who are enrolled in Medicaid or [place] Health Choice; pregnant women with a Medicaid card; and children who have applied for Medicaid or [place] Health Choice, but were declined, whose parents can pay a reduced fee at time of service.  °[place]  [facility] [place]  [address], [place] (336) 641-7733 Accepts children up to age 21 who are enrolled in Medicaid or [place] Health Choice; pregnant women with a Medicaid card; and children who have applied for Medicaid or  Health Choice, but were declined, whose parents can pay a reduced fee at time of service.  °[facility] ° [address],  (336) 641-4533 Patients are seen by appointment only. Walk-ins are not accepted. [facility] will see patients 18 years of age and older. °Monday - Tuesday (8am-5pm) °Most Wednesdays (8:30-5pm) °$30 per visit, cash only  °[facility] ° [address], [place] (336) 641-4533 Patients are seen by appointment only. Walk-ins are not accepted. [facility] will see patients 18 years of age and older. °One   Wednesday Evening (Monthly: Volunteer Based).  $30 per visit, cash only  °[facility]  (919) 537-3737 for adults; Children under age 4, call Graduate Pediatric Dentistry at (919) [phone]. Children aged 4-14, please call (919) [phone] to request a pediatric application. ° Dental services are provided in all areas of dental care including fillings, crowns and bridges, complete and partial dentures, implants, gum treatment, root canals, and extractions. Preventive care is also provided. Treatment is provided to both adults and children. °Patients are selected via a lottery and there is often a waiting list. °  °[facility] [address], °[place] ° (336) 763-8833 [email or web address] °  °[facility] [address], [place], [place] (336)723-1848, Ext. 123 Second and Fourth Thursday of each month, opens at 6:30 AM; Clinic ends at 9 AM.  Patients are seen on a first-come first-served basis, and a limited number are seen during each clinic.  ° °[facility] ° [address], [place], [place] (336) 723-7904    Eligibility Requirements °You must have lived in [place], [place], or [place] counties for at least the last three months. °  You cannot be eligible for state or federal sponsored healthcare insurance, including Veterans Administration, Medicaid, or Medicare. °  You generally cannot be eligible for healthcare insurance through your employer.  °  How to apply: °Eligibility screenings are held every Tuesday and Wednesday afternoon from 1:00 pm until 4:00 pm. You do not need an appointment for the interview!  °[facility] [address], [place], [place] 336-631-2330   °[place] [facility]  336-342-8273   °[place]  336-703-3100   °[facility]  336-570-6415   ° °Behavioral Health Resources in the Community: °Intensive Outpatient Programs °Organization         Address  Phone  Notes  °[facility] [address], [place], [place] 336-878-6098   °[facility] [address], [place], [place] 336-832-9800   °[facility] [address], [place], [place] ° 336-882-2125   °[facility] [address],  °[place], [place] 1-800-853-5163 or 336-641-4981   °Substance Abuse Resources °Organization         Address  Phone  Notes  °[facility]  336-882-2125   °[facility]  336-784-9470   °[facility]  336-285-9073   °[facility]  336-845-3988   °Residential & Outpatient Substance Abuse Program  1-800-659-3381   °Psychological Services °Organization         Address  Phone  Notes  °[facility]  336- 832-9600   °[facility]  336- 378-7881   °[facility] [address], [place] 1-800-853-5163 or 336-641-4981   ° °Mobile Crisis Teams °Organization         Address  Phone  Notes  °[facility], Mobile Crisis Care Unit  1-877-626-1772   °Assertive °[facility] ° [address]  [place], [place] 336-834-9664   °Sharon DeEsch [address] °[place] 336-554-5454   ° °Self-Help/Support Groups °Organization         Address  Phone             Notes  °[facility]  - variety of support groups  336- 373-1402 Call for more information  °Narcotics Anonymous (NA), [facility] [address], °[place]  2 meetings at this location  ° °  Residential Treatment Programs °Organization         Address  Phone  Notes  °[facility] [address],    °[place]  1-866-801-8205   °[facility] ° [address], [place], [place] 704-293-8524   °[facility] [address], [place] 336-845-3988 Admissions: 8am-3pm M-F  °[facility] [address]    °[place], [place] 336-841-1104   °[facility] [address], [place], [place] 336-379-7146   °[facility] [address]  °[place], [place] 336-285-9073   °[facility] Intensive Outpatient [address], [place], [place] 336-852-3033   °[facility] [address]  °[place], [place] 1-877-615-2722 or 336-784-9470   °[facility] [address] [place], [place] 336-227-7417 Accepts Medicaid  °[facility] [address]  °[place] 1-800-659-3381 Substance Abuse/Addiction Treatment  ° °[place] Behavioral Health Resources °Organization         Address  Phone  Notes  °[facility]  (888) 581-9988   °Julie Brannon, PhD [address] [place], [place]   (336) 349-5553 or (336) 951-0000   °[facility]   [address] °[place], [place] (336) 349-4454   °[facility] [address], [place], [place] (336) 342-8316 Insurance/Medicaid/sponsorship through [facility]  °[facility] [address]                                    [place], [place] (336) 342-8316 Therapy/tele-psych/case    °[facility] [address]  ° [place], [place] (336) 349-2233    °Dr. Arfeen  (336) 349-4544   °[facility] [place]  [employer] [place] [facility] 1) [address], [place] °2) [address], [place] °3)  [address], [place] (336) 349-3220 °(336) 342-7768 ° °(336) 342-8140   °[place] [facility] (336) 342-1394 or (336) 342-3537 (After Hours)    ° ° °

## 2015-04-04 ENCOUNTER — Encounter (HOSPITAL_COMMUNITY): Payer: Self-pay

## 2015-04-04 ENCOUNTER — Emergency Department (HOSPITAL_COMMUNITY)
Admission: EM | Admit: 2015-04-04 | Discharge: 2015-04-04 | Disposition: A | Discharge status: Home / Self Care | Payer: Medicaid Other | Attending: Emergency Medicine | Admitting: Emergency Medicine

## 2015-04-04 ENCOUNTER — Emergency Department (HOSPITAL_COMMUNITY): Payer: Medicaid Other

## 2015-04-04 DIAGNOSIS — S0181XA Laceration without foreign body of other part of head, initial encounter: Secondary | ICD-10-CM | POA: Diagnosis not present

## 2015-04-04 DIAGNOSIS — Y998 Other external cause status: Secondary | ICD-10-CM | POA: Insufficient documentation

## 2015-04-04 DIAGNOSIS — Z79899 Other long term (current) drug therapy: Secondary | ICD-10-CM | POA: Insufficient documentation

## 2015-04-04 DIAGNOSIS — Z872 Personal history of diseases of the skin and subcutaneous tissue: Secondary | ICD-10-CM | POA: Diagnosis not present

## 2015-04-04 DIAGNOSIS — F209 Schizophrenia, unspecified: Secondary | ICD-10-CM | POA: Insufficient documentation

## 2015-04-04 DIAGNOSIS — Z23 Encounter for immunization: Secondary | ICD-10-CM | POA: Diagnosis not present

## 2015-04-04 DIAGNOSIS — F10120 Alcohol abuse with intoxication, uncomplicated: Secondary | ICD-10-CM | POA: Insufficient documentation

## 2015-04-04 DIAGNOSIS — F319 Bipolar disorder, unspecified: Secondary | ICD-10-CM | POA: Insufficient documentation

## 2015-04-04 DIAGNOSIS — S0083XA Contusion of other part of head, initial encounter: Secondary | ICD-10-CM

## 2015-04-04 DIAGNOSIS — Y9289 Other specified places as the place of occurrence of the external cause: Secondary | ICD-10-CM | POA: Diagnosis not present

## 2015-04-04 DIAGNOSIS — F10920 Alcohol use, unspecified with intoxication, uncomplicated: Secondary | ICD-10-CM

## 2015-04-04 DIAGNOSIS — Y9389 Activity, other specified: Secondary | ICD-10-CM | POA: Insufficient documentation

## 2015-04-04 DIAGNOSIS — Z720 Tobacco use: Secondary | ICD-10-CM | POA: Diagnosis not present

## 2015-04-04 DIAGNOSIS — S0990XA Unspecified injury of head, initial encounter: Secondary | ICD-10-CM | POA: Diagnosis present

## 2015-04-04 LAB — CBC WITH DIFFERENTIAL/PLATELET
Basophils Absolute: 0 10*3/uL (ref 0.0–0.1)
Basophils Relative: 0 % (ref 0–1)
EOS ABS: 0 10*3/uL (ref 0.0–0.7)
Eosinophils Relative: 1 % (ref 0–5)
HCT: 38.7 % — ABNORMAL LOW (ref 39.0–52.0)
Hemoglobin: 13.2 g/dL (ref 13.0–17.0)
LYMPHS PCT: 24 % (ref 12–46)
Lymphs Abs: 1.5 10*3/uL (ref 0.7–4.0)
MCH: 28.9 pg (ref 26.0–34.0)
MCHC: 34.1 g/dL (ref 30.0–36.0)
MCV: 84.7 fL (ref 78.0–100.0)
Monocytes Absolute: 0.4 10*3/uL (ref 0.1–1.0)
Monocytes Relative: 6 % (ref 3–12)
Neutro Abs: 4.3 10*3/uL (ref 1.7–7.7)
Neutrophils Relative %: 69 % (ref 43–77)
PLATELETS: 300 10*3/uL (ref 150–400)
RBC: 4.57 MIL/uL (ref 4.22–5.81)
RDW: 13.4 % (ref 11.5–15.5)
WBC: 6.2 10*3/uL (ref 4.0–10.5)

## 2015-04-04 LAB — COMPREHENSIVE METABOLIC PANEL
ALT: 31 U/L (ref 17–63)
ANION GAP: 9 (ref 5–15)
AST: 44 U/L — AB (ref 15–41)
Albumin: 3.6 g/dL (ref 3.5–5.0)
Alkaline Phosphatase: 69 U/L (ref 38–126)
BILIRUBIN TOTAL: 0.5 mg/dL (ref 0.3–1.2)
BUN: 8 mg/dL (ref 6–20)
CO2: 23 mmol/L (ref 22–32)
Calcium: 8.9 mg/dL (ref 8.9–10.3)
Chloride: 111 mmol/L (ref 101–111)
Creatinine, Ser: 0.91 mg/dL (ref 0.61–1.24)
Glucose, Bld: 115 mg/dL — ABNORMAL HIGH (ref 65–99)
Potassium: 4 mmol/L (ref 3.5–5.1)
Sodium: 143 mmol/L (ref 135–145)
TOTAL PROTEIN: 6.3 g/dL — AB (ref 6.5–8.1)

## 2015-04-04 LAB — I-STAT CG4 LACTIC ACID, ED: Lactic Acid, Venous: 2.61 mmol/L (ref 0.5–2.0)

## 2015-04-04 LAB — ETHANOL: Alcohol, Ethyl (B): 134 mg/dL — ABNORMAL HIGH (ref <5)

## 2015-04-04 MED ORDER — SODIUM CHLORIDE 0.9 % IV SOLN
1000.0000 mL | INTRAVENOUS | Status: DC
  Administered 2015-04-04: 1000 mL via INTRAVENOUS

## 2015-04-04 MED ORDER — TETANUS-DIPHTH-ACELL PERTUSSIS 5-2.5-18.5 LF-MCG/0.5 IM SUSP
0.5000 mL | Freq: Once | INTRAMUSCULAR | Status: AC
  Administered 2015-04-04: 0.5 mL via INTRAMUSCULAR
  Filled 2015-04-04: qty 0.5

## 2015-04-04 MED ORDER — SODIUM CHLORIDE 0.9 % IV SOLN
1000.0000 mL | Freq: Once | INTRAVENOUS | Status: AC
  Administered 2015-04-04: 1000 mL via INTRAVENOUS
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
## 2015-04-04 NOTE — ED Notes (Signed)
Dermabond at bedside.  

## 2015-04-04 NOTE — ED Notes (Signed)
Patient transported to CT 

## 2015-04-04 NOTE — ED Notes (Signed)
MD at bedside. 

## 2015-04-04 NOTE — ED Notes (Signed)
Phlebotomist notified Dr. Preston FleetingGlick on elevated lactic acid result.
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
## 2015-11-02 ENCOUNTER — Emergency Department (HOSPITAL_COMMUNITY): Payer: Medicaid Other

## 2015-11-02 ENCOUNTER — Emergency Department (HOSPITAL_COMMUNITY)
Admission: EM | Admit: 2015-11-02 | Discharge: 2015-11-02 | Disposition: A | Discharge status: Home / Self Care | Payer: Medicaid Other | Attending: Emergency Medicine | Admitting: Emergency Medicine

## 2015-11-02 ENCOUNTER — Encounter (HOSPITAL_COMMUNITY): Payer: Self-pay | Admitting: *Deleted

## 2015-11-02 DIAGNOSIS — F209 Schizophrenia, unspecified: Secondary | ICD-10-CM | POA: Insufficient documentation

## 2015-11-02 DIAGNOSIS — S90511A Abrasion, right ankle, initial encounter: Secondary | ICD-10-CM | POA: Insufficient documentation

## 2015-11-02 DIAGNOSIS — F319 Bipolar disorder, unspecified: Secondary | ICD-10-CM | POA: Insufficient documentation

## 2015-11-02 DIAGNOSIS — Y998 Other external cause status: Secondary | ICD-10-CM | POA: Insufficient documentation

## 2015-11-02 DIAGNOSIS — Y9389 Activity, other specified: Secondary | ICD-10-CM | POA: Insufficient documentation

## 2015-11-02 DIAGNOSIS — Z8719 Personal history of other diseases of the digestive system: Secondary | ICD-10-CM | POA: Diagnosis not present

## 2015-11-02 DIAGNOSIS — Z79899 Other long term (current) drug therapy: Secondary | ICD-10-CM | POA: Diagnosis not present

## 2015-11-02 DIAGNOSIS — S99911A Unspecified injury of right ankle, initial encounter: Secondary | ICD-10-CM | POA: Diagnosis present

## 2015-11-02 DIAGNOSIS — F17210 Nicotine dependence, cigarettes, uncomplicated: Secondary | ICD-10-CM | POA: Insufficient documentation

## 2015-11-02 DIAGNOSIS — S0990XA Unspecified injury of head, initial encounter: Secondary | ICD-10-CM | POA: Diagnosis not present

## 2015-11-02 DIAGNOSIS — Y92410 Unspecified street and highway as the place of occurrence of the external cause: Secondary | ICD-10-CM | POA: Diagnosis not present

## 2015-11-02 DIAGNOSIS — Z792 Long term (current) use of antibiotics: Secondary | ICD-10-CM | POA: Diagnosis not present

## 2015-11-02 DIAGNOSIS — M25571 Pain in right ankle and joints of right foot: Secondary | ICD-10-CM
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
## 2015-11-02 NOTE — Discharge Instructions (Signed)
Be sure to read and understand instructions below prior to leaving the hospital. If your symptoms persist without any improvement in 1 week it is recommended that you follow up with the orthopedist listed above. Take tylenol and/or ibuprofen as needed for pain.   TREATMENT  Rest, ice, elevation, and compression are the basic modes of treatment.    HOME CARE INSTRUCTIONS  Apply ice to the sore area for 15 to 20 minutes, 3 to 4 times per day. Do this while you are awake for the first 2 days. This can be stopped when the swelling goes away. Put the ice in a plastic bag and place a towel between the bag of ice and your skin.  Keep your leg elevated when possible to lessen swelling.  If your caregiver recommends crutches, use them as instructed for 1 week. Then, you may walk on your ankle weight bearing as tolerated.  You may take off your ankle stabilizer at night and to take a shower or bath. Wiggle your toes in the splint several times per day if you are able.  Do not drive a vehicle on pain medication. ACTIVITY:            - Weight bearing as tolerated            - Exercises should be limited to pain free range of motion            - Can start mobilization by tracing the alphabet with your foot in the air.       SEEK MEDICAL CARE IF:  You have an increase in bruising, swelling, or pain.  Your toes feel cold.  Pain relief is not achieved with medications.  EMERGENCY:: Your toes are numb or blue or you have severe pain.   MAKE SURE YOU:  Understand these instructions.  Will watch your condition.  Will get help right away if you are not doing well or get worse   COLD THERAPY DIRECTIONS:  Ice or gel packs can be used to reduce both pain and swelling. Ice is the most helpful within the first 24 to 48 hours after an injury or flareup from overusing a muscle or joint.  Ice is effective, has very few side effects, and is safe for most people to use.   If you expose your skin to cold  temperatures for too long or without the proper protection, you can damage your skin or nerves. Watch for signs of skin damage due to cold.   HOME CARE INSTRUCTIONS  Follow these tips to use ice and cold packs safely.  Place a dry or damp towel between the ice and skin. A damp towel will cool the skin more quickly, so you may need to shorten the time that the ice is used.  For a more rapid response, add gentle compression to the ice.  Ice for no more than 10 to 20 minutes at a time. The bonier the area you are icing, the less time it will take to get the benefits of ice.  Check your skin after 5 minutes to make sure there are no signs of a poor response to cold or skin damage.  Rest 20 minutes or more in between uses.  Once your skin is numb, you can end your treatment. You can test numbness by very lightly touching your skin. The touch should be so light that you do not see the skin dimple from the pressure of your fingertip. When using ice, most people  will feel these normal sensations in this order: cold, burning, aching, and numbness.
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
